# Patient Record
Sex: Female | Born: 1935
Health system: Southern US, Community
[De-identification: ages and names within clinical notes are randomized; demographics above are authoritative.]

## PROBLEM LIST (undated history)

## (undated) DIAGNOSIS — I4892 Unspecified atrial flutter: Secondary | ICD-10-CM

## (undated) DIAGNOSIS — I4891 Unspecified atrial fibrillation: Secondary | ICD-10-CM

## (undated) DIAGNOSIS — J849 Interstitial pulmonary disease, unspecified: Secondary | ICD-10-CM

## (undated) HISTORY — DX: Unspecified atrial fibrillation: I48.91

## (undated) HISTORY — DX: Interstitial pulmonary disease, unspecified: J84.9

## (undated) HISTORY — DX: Unspecified atrial flutter: I48.92

---

## 2005-09-24 ENCOUNTER — Ambulatory Visit: Payer: Self-pay | Admitting: Cardiology

## 2005-10-12 ENCOUNTER — Ambulatory Visit: Payer: Self-pay | Admitting: Cardiology

## 2005-10-19 ENCOUNTER — Inpatient Hospital Stay (HOSPITAL_BASED_OUTPATIENT_CLINIC_OR_DEPARTMENT_OTHER): Admission: RE | Admit: 2005-10-19 | Discharge: 2005-10-19 | Payer: Self-pay | Admitting: Cardiology

## 2005-10-19 ENCOUNTER — Ambulatory Visit: Payer: Self-pay | Admitting: Cardiovascular Disease

## 2005-11-10 ENCOUNTER — Ambulatory Visit: Payer: Self-pay | Admitting: Cardiology

## 2008-12-29 ENCOUNTER — Ambulatory Visit: Payer: Self-pay | Admitting: Cardiology

## 2008-12-31 ENCOUNTER — Inpatient Hospital Stay (HOSPITAL_COMMUNITY): Admission: AD | Admit: 2008-12-31 | Discharge: 2009-01-02 | Payer: Self-pay | Admitting: Cardiology

## 2008-12-31 ENCOUNTER — Ambulatory Visit: Payer: Self-pay | Admitting: Internal Medicine

## 2009-01-01 ENCOUNTER — Encounter: Payer: Self-pay | Admitting: Internal Medicine

## 2009-01-02 ENCOUNTER — Encounter: Payer: Self-pay | Admitting: Cardiology

## 2009-01-24 ENCOUNTER — Ambulatory Visit: Payer: Self-pay | Admitting: Cardiology

## 2009-02-04 ENCOUNTER — Ambulatory Visit: Payer: Self-pay | Admitting: Cardiology

## 2009-02-04 ENCOUNTER — Telehealth: Payer: Self-pay | Admitting: Cardiology

## 2009-02-04 ENCOUNTER — Encounter: Payer: Self-pay | Admitting: Cardiology

## 2009-02-07 ENCOUNTER — Ambulatory Visit: Payer: Self-pay | Admitting: Cardiology

## 2009-02-07 ENCOUNTER — Encounter: Payer: Self-pay | Admitting: Physician Assistant

## 2009-02-09 ENCOUNTER — Encounter: Payer: Self-pay | Admitting: Cardiology

## 2009-02-10 ENCOUNTER — Ambulatory Visit: Payer: Self-pay | Admitting: Cardiology

## 2009-02-11 ENCOUNTER — Encounter: Payer: Self-pay | Admitting: Cardiology

## 2009-02-12 ENCOUNTER — Encounter: Payer: Self-pay | Admitting: Cardiology

## 2009-02-14 ENCOUNTER — Encounter: Payer: Self-pay | Admitting: Cardiology

## 2009-02-15 ENCOUNTER — Encounter: Payer: Self-pay | Admitting: Cardiology

## 2009-02-25 ENCOUNTER — Encounter: Payer: Self-pay | Admitting: Cardiology

## 2009-02-27 ENCOUNTER — Encounter: Payer: Self-pay | Admitting: Cardiology

## 2009-03-12 ENCOUNTER — Ambulatory Visit: Payer: Self-pay | Admitting: Cardiology

## 2009-05-31 DIAGNOSIS — I4892 Unspecified atrial flutter: Secondary | ICD-10-CM

## 2009-05-31 DIAGNOSIS — I4891 Unspecified atrial fibrillation: Secondary | ICD-10-CM | POA: Insufficient documentation

## 2009-07-14 ENCOUNTER — Encounter: Payer: Self-pay | Admitting: Cardiology

## 2009-07-17 ENCOUNTER — Encounter: Payer: Self-pay | Admitting: Cardiology

## 2009-07-17 ENCOUNTER — Ambulatory Visit: Payer: Self-pay | Admitting: Cardiology

## 2009-07-17 DIAGNOSIS — G4700 Insomnia, unspecified: Secondary | ICD-10-CM | POA: Insufficient documentation

## 2009-07-17 DIAGNOSIS — F411 Generalized anxiety disorder: Secondary | ICD-10-CM | POA: Insufficient documentation

## 2009-07-17 DIAGNOSIS — I10 Essential (primary) hypertension: Secondary | ICD-10-CM | POA: Insufficient documentation

## 2009-07-17 DIAGNOSIS — J8410 Pulmonary fibrosis, unspecified: Secondary | ICD-10-CM | POA: Insufficient documentation

## 2009-07-19 ENCOUNTER — Encounter: Payer: Self-pay | Admitting: Cardiology

## 2009-07-23 ENCOUNTER — Encounter (INDEPENDENT_AMBULATORY_CARE_PROVIDER_SITE_OTHER): Payer: Self-pay | Admitting: *Deleted

## 2009-08-10 ENCOUNTER — Encounter: Payer: Self-pay | Admitting: Cardiology

## 2010-01-21 ENCOUNTER — Ambulatory Visit: Payer: Self-pay | Admitting: Cardiology

## 2010-01-21 DIAGNOSIS — R42 Dizziness and giddiness: Secondary | ICD-10-CM

## 2010-01-24 ENCOUNTER — Encounter: Payer: Self-pay | Admitting: Cardiology

## 2010-04-25 ENCOUNTER — Encounter: Payer: Self-pay | Admitting: Cardiology

## 2010-05-22 ENCOUNTER — Encounter: Payer: Self-pay | Admitting: Cardiology

## 2010-06-23 ENCOUNTER — Encounter: Payer: Self-pay | Admitting: Cardiology

## 2010-08-22 ENCOUNTER — Ambulatory Visit: Payer: Self-pay | Admitting: Cardiology

## 2010-08-22 DIAGNOSIS — R0602 Shortness of breath: Secondary | ICD-10-CM

## 2010-09-05 ENCOUNTER — Encounter: Payer: Self-pay | Admitting: Cardiology

## 2010-09-19 ENCOUNTER — Encounter: Payer: Self-pay | Admitting: Cardiology

## 2010-09-30 NOTE — Assessment & Plan Note (Signed)
Summary: 6 MO FU PER MAY REMINDER-SRS   Visit Type:  Follow-up Primary Provider:  Sherryll Burger   History of Present Illness: the patient is a 77 year old female with a history of paroxysmal atrial fibrillation maintaining normal sinus rhythm on amiodarone therapy. The patient also has a history of mild interstitial lung disease with a moderately decreased DLCO. Pulmonary function studies however less than a year ago showed stable DLCO. The patient also is a prior history of atrial flutter with radiofrequency catheter ablation. She had a normal heart catheterization with normal LV function. The patient continues to decline Coumadin.  She reports no recurrent palpitations short of breath orthopnea PND. The patient may complaints orthostatic dizziness. She states her to go into evening when she gets up she becomes dizzy for a few minutes and then slowly resolves. She also took her blood pressure readings and they have been within normal limits. The patient is on vasodilator therapy that could explain her orthostasis. She is currently not wearing compression stockings.  Current Medications (verified): 1)  Hydrochlorothiazide 12.5 Mg Caps (Hydrochlorothiazide) .... Take 1 Tablet By Mouth Once A Day 2)  Aspirin 325 Mg Tabs (Aspirin) .... Take 1 Tablet By Mouth Once A Day 3)  Omeprazole 20 Mg Cpdr (Omeprazole) .... Take 2 Tablet By Mouth Once A Day 4)  Diltiazem Hcl Cr 120 Mg Xr12h-Cap (Diltiazem Hcl) .... Take 1 Tablet By Mouth Once A Day 5)  Amiodarone Hcl 200 Mg Tabs (Amiodarone Hcl) .... Take 1 Tablet By Mouth Once A Day 6)  Alprazolam 0.25 Mg Tabs (Alprazolam) .... Take 1/2 Tablet By Mouth Once A Day 7)  Caltrate 600+d 600-400 Mg-Unit Tabs (Calcium Carbonate-Vitamin D) .... Take 1 Tablet By Mouth Two Times A Day 8)  Trazodone Hcl 50 Mg Tabs (Trazodone Hcl) .... Take 1/2 Tablet At Bedtime For Sleep, May Increase To One Tablet As Needed 9)  Lisinopril 10 Mg Tabs (Lisinopril) .... Take 1 Tablet By Mouth Once  A Day  Allergies (verified): No Known Drug Allergies  Comments:  Nurse/Medical Assistant: The patient's medications and allergies were reviewed with the patient and were updated in the Medication and Allergy Lists. List reviewed.  Past History:  Past Medical History: Last updated: 07/17/2009 ATRIAL FLUTTER (ICD-427.32) ATRIAL FIBRILLATION (ICD-427.31) Interstitial lung disease 1. Status post atrial flutter with 1:1 conduction status post     radiocatheter frequency ablation. 2. Atypical flutter and atrial fibrillation induced EP study with     recurrence requiring amiodarone therapy. 3. Currently normal sinus rhythm. 4. Normal catheterization several years ago.  Normal LV function. 5. The patient declining Coumadin therapy presently. 6. Interstitial lung disease with a TLC of 65%.  Family History: Reviewed history and no changes required. Noncontributory  Social History: Reviewed history from 05/31/2009 and no changes required. Retired  Married  Tobacco Use - No.   Review of Systems  The patient denies fatigue, malaise, fever, weight gain/loss, vision loss, decreased hearing, hoarseness, chest pain, palpitations, shortness of breath, prolonged cough, wheezing, sleep apnea, coughing up blood, abdominal pain, blood in stool, nausea, vomiting, diarrhea, heartburn, incontinence, blood in urine, muscle weakness, joint pain, leg swelling, rash, skin lesions, headache, fainting, dizziness, depression, anxiety, enlarged lymph nodes, easy bruising or bleeding, and environmental allergies.    Vital Signs:  Patient profile:   77 year old female Height:      64 inches Weight:      167 pounds Pulse rate:   67 / minute BP sitting:   154 / 86  (  left arm) Cuff size:   regular  Vitals Entered By: Carlye Grippe (Jan 21, 2010 10:16 AM)  Physical Exam  Additional Exam:  General: Well-developed, well-nourished in no distress head: Normocephalic and atraumatic eyes PERRLA/EOMI  intact, conjunctiva and lids normal nose: No deformity or lesions mouth normal dentition, normal posterior pharynx neck: Supple, no JVD.  No masses, thyromegaly or abnormal cervical nodes lungs: Normal breath sounds bilaterally without wheezing.  Normal percussion heart: regular rate and rhythm with normal S1 and S2, no S3 or S4.  PMI is normal.  No pathological murmurs abdomen: Normal bowel sounds, abdomen is soft and nontender without masses, organomegaly or hernias noted.  No hepatosplenomegaly musculoskeletal: Back normal, normal gait muscle strength and tone normal pulsus: Pulse is normal in all 4 extremities Extremities: No peripheral pitting edema neurologic: Alert and oriented x 3 skin: Intact without lesions or rashes cervical nodes: No significant adenopathy psychologic: Normal affect    EKG  Procedure date:  01/21/2010  Findings:      normal sinus rhythm. Nonspecific ST-T wave changes. Heart rate 64 beats per minute  Impression & Recommendations:  Problem # 1:  ATRIAL FLUTTER (ICD-427.32) no recurrence status post ablation Her updated medication list for this problem includes:    Aspirin 325 Mg Tabs (Aspirin) .Marland Kitchen... Take 1 tablet by mouth once a day    Amiodarone Hcl 200 Mg Tabs (Amiodarone hcl) .Marland Kitchen... Take 1/2 tablet by mouth once a day  Problem # 2:  ATRIAL FIBRILLATION (ICD-427.31) patient remaining in normal sinus rhythm on amiodarone therapy. However given her mild degree of interstitial lung disease and long-term therapy we will decrease her amiodarone therapy 200 mg a day. Her updated medication list for this problem includes:    Aspirin 325 Mg Tabs (Aspirin) .Marland Kitchen... Take 1 tablet by mouth once a day    Amiodarone Hcl 200 Mg Tabs (Amiodarone hcl) .Marland Kitchen... Take 1/2 tablet by mouth once a day  Orders: EKG w/ Interpretation (93000) T-Hepatic Function (44034-74259) T-TSH (56387-56433)  Problem # 3:  ORTHOSTATIC DIZZINESS (ICD-780.4) likely secondary to patient's  advanced age and multiple vasodilator drugs. Decrease her lisinopril to 5 mg p.o. q. daily and recommended compression stockings. I would not further decrease her blood pressure medications as the patient does have hypertension.  Problem # 4:  COUMADIN THERAPY (ICD-V58.61) patient continues to decline Coumadin therapy.  Patient Instructions: 1)  Decrease Amiodarone to 100mg  by mouth once daily. This is 1/2 of your 200 mg tablet. 2)  Decrease Lisinopril to 5mg  by mouth once daily. This is 1/2 of your 10mg  tablet. 3)  Your physician recommends that you go to the [facility] for lab work. Do not eat or drink after midnight.  4)  Wear compression stockings. 5)  Your physician wants you to follow-up in: 6 months. You will receive a reminder letter in the mail one-two months in advance. If you don't receive a letter, please call our office to schedule the follow-up appointment.

## 2010-09-30 NOTE — Medication Information (Signed)
Summary: Compression Stockings RX  Compression Stockings [facility]   Imported By: Cyril Loosen, RN, BSN 01/21/2010 14:22:17  _____________________________________________________________________  External Attachment:    Type:   Image     Comment:   External Document

## 2010-10-02 NOTE — Assessment & Plan Note (Signed)
Summary: 6 MO FU PER NOV REMINDER-SRS   Visit Type:  Follow-up Primary Provider:  Sherryll Burger   History of Present Illness: patient presents for scheduled 6 month followup.  She does not present with signs or symptoms suggestive of any significant change from her baseline level of exercise tolerance. She continues to report DOE, but no orthopnea, PND, or significant LE edema. She denies exertional chest pain. She continues to have some positional dizziness, but no frank syncope, falls, or gait instability.  The issue of Coumadin anticoagulation had been broached in the past. she continues to decline Coumadin, and is on full dose aspirin.  She also spoke about recommended cholecystectomy, which she is postponing until after the holidays. She also states that she needs a tooth pulled. She complains of intermittent back pain, when washing dishes.  She has intermittent palpitations, but with no significant change from baseline.  Preventive Screening-Counseling & Management  Alcohol-Tobacco     Smoking Status: never  Current Medications (verified): 1)  Hydrochlorothiazide 12.5 Mg Caps (Hydrochlorothiazide) .... Take 1 Tablet By Mouth Once A Day 2)  Aspirin 325 Mg Tabs (Aspirin) .... Take 1 Tablet By Mouth Once A Day 3)  Omeprazole 20 Mg Cpdr (Omeprazole) .... Take 2 Tablet By Mouth Once A Day 4)  Diltiazem Hcl Cr 120 Mg Xr12h-Cap (Diltiazem Hcl) .... Take 1 Tablet By Mouth Once A Day 5)  Amiodarone Hcl 200 Mg Tabs (Amiodarone Hcl) .... Take 1 Tablet By Mouth Once A Day 6)  Alprazolam 0.25 Mg Tabs (Alprazolam) .... Take 1/2 Tablet By Mouth Once A Day 7)  Caltrate 600+d 600-400 Mg-Unit Tabs (Calcium Carbonate-Vitamin D) .... Take 1 Tablet By Mouth Two Times A Day 8)  Trazodone Hcl 50 Mg Tabs (Trazodone Hcl) .... Take 1/2 Tablet At Bedtime For Sleep, May Increase To One Tablet As Needed 9)  Lisinopril 10 Mg Tabs (Lisinopril) .... Take 1/2 Tablet By Mouth Once A Day (5mg )  Allergies: No Known Drug  Allergies  Comments:  Nurse/Medical Assistant: The patient's medications and allergies were reviewed with the patient and were updated in the Medication and Allergy Lists. Pt brought a list of medications to office visit.  Cyril Loosen, RN, BSN (August 22, 2010 2:04 PM)  Past History:  Past Medical History: Last updated: 07/17/2009 ATRIAL FLUTTER (ICD-427.32) ATRIAL FIBRILLATION (ICD-427.31) Interstitial lung disease 1. Status post atrial flutter with 1:1 conduction status post     radiocatheter frequency ablation. 2. Atypical flutter and atrial fibrillation induced EP study with     recurrence requiring amiodarone therapy. 3. Currently normal sinus rhythm. 4. Normal catheterization several years ago.  Normal LV function. 5. The patient declining Coumadin therapy presently. 6. Interstitial lung disease with a TLC of 65%.  Review of Systems       No fevers, chills, hemoptysis, dysphagia, melena, hematocheezia, hematuria, rash, claudication, orthopnea, pnd, pedal edema. All other systems negative.   Vital Signs:  Patient profile:   77 year old female Height:      64 inches Weight:      149.25 pounds BMI:     25.71 Pulse rate:   69 / minute BP sitting:   142 / 79  (left arm) Cuff size:   regular  Vitals Entered By: Cyril Loosen, RN, BSN (August 22, 2010 2:01 PM)  Nutrition Counseling: Patient's BMI is greater than 25 and therefore counseled on weight management options. Comments Follow up office visit   Physical Exam  Additional Exam:  GEN: 77 year old female, in no distress  HEENT: NCAT,PERRLA,EOMI NECK: palpable pulses, no bruits; no JVD; no TM LUNGS: CTA bilaterally HEART: RRR (S1S2); no significant murmurs; no rubs; no gallops ABD: soft, NT; intact BS EXT: intact distal pulses; trace peripheral edema SKIN: warm, dry MUSC: no obvious deformity NEURO: A/O (x3)     Impression & Recommendations:  Problem # 1:  ATRIAL FLUTTER  (ICD-427.32)  maintaining NSR, by clinical presentation and physical examination. Status post RF ablation.  Problem # 2:  ATRIAL FIBRILLATION (ICD-427.31)  maintaining NSR on amiodarone. Patient continues to decline Coumadin (CHADS: 2, HTN, age), and is on full dose aspirin.  Problem # 3:  INTERSTITIAL LUNG DISEASE (ICD-515)  we'll order surveillance PFTs with DLCO and CXR, in context of amiodarone therapy. patient had normal TSH and liver function tests, earlier this year.  Problem # 4:  ORTHOSTATIC DIZZINESS (ICD-780.4)  as had been previously recommended, will decrease ACE inhibitor to 5 mg daily.  Other Orders: EKG w/ Interpretation (93000) T-Chest x-ray, 2 views (47829) Pulmonary Function Test (PFT)  Patient Instructions: 1)  Decrease Lisinopril to 5mg  daily 2)  PFT 3)  chest x-ray 4)  Follow up in  6 months

## 2010-11-12 DIAGNOSIS — I4891 Unspecified atrial fibrillation: Secondary | ICD-10-CM

## 2010-11-12 DIAGNOSIS — Z01810 Encounter for preprocedural cardiovascular examination: Secondary | ICD-10-CM

## 2010-11-13 DIAGNOSIS — R072 Precordial pain: Secondary | ICD-10-CM

## 2010-12-09 LAB — CBC
MCHC: 34 g/dL (ref 30.0–36.0)
MCV: 91.4 fL (ref 78.0–100.0)
Platelets: 212 10*3/uL (ref 150–400)
RBC: 4.34 MIL/uL (ref 3.87–5.11)
RDW: 14.3 % (ref 11.5–15.5)

## 2010-12-09 LAB — BASIC METABOLIC PANEL
BUN: 9 mg/dL (ref 6–23)
CO2: 28 mEq/L (ref 19–32)
Calcium: 8.6 mg/dL (ref 8.4–10.5)
Chloride: 104 mEq/L (ref 96–112)
Creatinine, Ser: 0.87 mg/dL (ref 0.4–1.2)
GFR calc Af Amer: >60 mL/min (ref >60)

## 2010-12-09 LAB — MAGNESIUM: Magnesium: 2.2 mg/dL (ref 1.5–2.5)

## 2011-01-13 NOTE — Op Note (Signed)
NAMECASSADI, PURDIE             ACCOUNT NO.:  [id number]   MEDICAL RECORD NO.:  [id number]          PATIENT TYPE:  INP   LOCATION:  2504                         FACILITY:  [facility]   PHYSICIAN:  Hillis Range, MD       DATE OF BIRTH:  [birth year]   DATE OF PROCEDURE:  DATE OF DISCHARGE:                               OPERATIVE REPORT   PREPROCEDURE DIAGNOSIS:  Atrial flutter.   POSTPROCEDURE DIAGNOSES:  1. Typical-appearing isthmus from right atrial flutter.  2. Atypical atrial flutter.  3. Atrial fibrillation.   PROCEDURES:  1. Comprehensive EP study.  2. Coronary sinus pacing and recording.  3. Mapping of SVT.  4. Ablation of SVT.  5. Ibutilide infusion/chemical cardioversion.   INTRODUCTION:  Ms. Erica [name] is a pleasant 77 year old female with a  history of palpitations.  She recently presented to [facility]  with atrial flutter with 1:1 AV conduction.  EKG was suggestive of  typical-appearing atrial flutter.  She received intravenous diltiazem  and converted to sinus rhythm.  She now presents for EP study and  radiofrequency ablation.   DESCRIPTION OF THE PROCEDURE:  Informed written consent was obtained and  the patient was brought to the Electrophysiology Lab in the fasting  state.  She was adequately sedated with intravenous Versed and fentanyl  as outlined in the nursing report.  The patient's right groin was  prepped and draped in the usual sterile fashion by the EP lab staff.  Using a percutaneous Seldinger technique, two 6-French and one 8-French  hemostasis sheaths were placed in the right common femoral vein.  A 6-  French decapolar Polaris X catheter was introduced through the right  common femoral vein and advanced into the coronary sinus for recording  and pacing from this location.  A 6-French quadripolar Josephson  catheter was introduced through the right common femoral vein and  advanced into the right ventricle for recording and pacing.  This  catheter  was then pulled back to the His bundle location.  The patient  presented to the Electrophysiology Lab in normal sinus rhythm.  Her RR  interval measured 1026 milliseconds with a PR interval of 167  milliseconds, QT interval of 445 milliseconds, and QRS duration of 106  milliseconds.  The AH interval measured 108 milliseconds with an HV  interval of 41 milliseconds.  Ventricular pacing was performed which  revealed midline VA conduction with VA Wenckebach at 700 milliseconds.  Atrial pacing was performed, which revealed no evidence of PR greater  than RR.  The AV Wenckebach cycle length measured 410 milliseconds with  no tachycardias induced.  Atrial extrastimulus testing was performed,  which revealed decremental AV conduction with an atrial ERP of 500/250  milliseconds with no tachycardias induced.  There was no AH jump or echo  beats noted.  Rapid atrial pacing was again performed and the patient  was found to have inducible atrial tachycardia when pacing and at a  cycle length of 250 milliseconds.  The tachycardia cycle length measured  232 milliseconds with a right atrial activation preceding the left  atrial activation.  A surface electrogram  was not consistent with  typical-appearing atrial flutter, however.  Before the tachycardia could  be anatomically mapped or entrained, the tachycardia cycle length  shifted as well as the atrial activation sequence.  The patient was then  noted to have multiple activation sequences of the atria as well as  variable cycle lengths.  The patient was noted to have frequent earliest  activation from the left atrium.  The patient then degenerated into  atrial fibrillation and remained in atrial fibrillation thereafter.  Given the patient's prior EKGs suggestive of typical atrial flutter, I  elected to ablate along the usual cavotricuspid isthmus.  A series of 16  radiofrequency applications were delivered between the tricuspid valve  annulus and the  inferior vena cava along the usual cavotricuspid isthmus  with a duration of 120 seconds each.  The isthmus was noted to be very  short.  The patient remained in atrial fibrillation during the  procedure.  After extensive cavotricuspid ablation, ibutilide was  infused at 1 mg over 10 minutes.  The patient was noted to occasionally  convert to sinus rhythm but immediately returned to atypical atrial  flutter and atrial fibrillation.  There was no further evidence of right  atrial flutter by coronary sinus activation sequence.  Again, the atrial  cycle length was noted to be variable with variable activation along the  coronary sinus.  An additional 0.5 mg of ibutilide was infused, and the  patient's QT interval was followed closely.  The patient subsequently  converted to sinus rhythm.  Once the patient had converted to sinus  rhythm, the ablation catheter was positioned in the low lateral right  atrium.  Differential atrial pacing was performed, which revealed a  stimulus to earliest atrial activation across both sides of the isthmus  of 150 milliseconds.  Differential atrial pacing from low lateral right  atrium was suggestive of isthmus block.  The procedure was therefore  considered completed.  All catheters were removed and sheaths were  aspirated and flushed.  The sheaths were removed and hemostasis was  assured.  There were no early apparent complications.   CONCLUSIONS:  1. Sinus rhythm upon presentation.  2. Multiple atypical atrial flutter circuits and atrial fibrillation      induced with rapid atrial pacing, which was sustained and required      ibutilide infusion for termination of the arrhythmia.  3. Cavotricuspid isthmus ablation was performed with isthmus block      achieved.  4. No early apparent complications.      Hillis Range, MD  Electronically Signed     JA/MEDQ  D:  01/01/2009  T:  01/02/2009  Job:  [id number]   cc:   Learta Codding, MD,FACC  D[facility] Salvia,  MD, [facility]

## 2011-01-13 NOTE — Assessment & Plan Note (Signed)
[facility]                          [name]   KAYRON, KALMAR                    MRN:          [id number]  DATE:03/12/2009                            DOB:          05-Oct-1927    REFERRING PHYSICIAN:  Kirstie Peri, MD   HISTORY OF PRESENT ILLNESS:  The patient is a 77 year old female with a  history of atrial flutter with 1:1 conduction status post radiocatheter  frequency ablation.  Unfortunately, the patient however has also  atypical flutter and recurrent atrial fibrillation requiring initiation  of amiodarone therapy.  PFTs were obtained initially because the patient  had some evidence of abnormalities on chest x-ray consistent with  emphysema and interstitial lung disease.  Her DLCO, however, was 65 and  her FEV-1 was 91% of predicted.  She was started on amiodarone and has  not experienced any worsening dyspnea.  She has now remained in normal  sinus rhythm after several hospitalizations.  She denies any chest pain,  orthopnea, PND, palpitations, or syncope.  Unfortunately, she is  suffering from the shingles currently and has taken acyclovir.   MEDICATIONS:  1. Hydrochlorothiazide 12.5 mg p.o. daily.  2. Captopril 100 mg p.o. daily.  3. Aspirin 325 mg daily p.o. daily.  4. Omeprazole 20 mg p.o. daily.  5. Diltiazem CD 120 mg p.o. daily.  6. Amiodarone 200 mg p.o. b.i.d.  7. Acyclovir 400 mg 2 tablets 5 times for 5 days.   PHYSICAL EXAMINATION:  VITAL SIGNS:  Blood pressure 142/84, heart rate  80, weight 160 pounds.  NECK:  Normal carotid upstroke.  No carotid bruits.  LUNGS:  Clear breath sounds bilaterally.  HEART:  Regular rate and rhythm.  Normal S1 and S2.  No murmur, rubs, or  gallops.  ABDOMEN:  Soft.  EXTREMITIES:  No cyanosis, clubbing, or edema.  NEURO:  The patient is alert and oriented.  Grossly nonfocal.   PROBLEMS:  1. Status post atrial flutter with 1:1 conduction status post  radiocatheter frequency ablation.  2. Atypical flutter and atrial fibrillation induced EP study with      recurrence requiring amiodarone therapy.  3. Currently normal sinus rhythm.  4. Normal catheterization several years ago.  Normal LV function.  5. The patient declining Coumadin therapy presently.  6. Interstitial lung disease with a TLC of 65%.   PLAN:  1. We will have to be very careful with amiodarone and the patient      should be monitored in 6-8 weeks with repeat pulmonary function      tests and DLCO to make sure that she has no adverse effects from      the drug.  As she does not report any increased shortness of      breath, as a matter fact she feels much better and normal sinus      rhythm currently  2. The patient will follow up with [place] in 6-8 weeks at which time we      will also will do TSH and liver function test.     Learta Codding, MD,FACC  Electronically Signed  GED/MedQ  DD: 03/12/2009  DT: 03/13/2009  Job #: [id number]   cc:   Kirstie Peri, MD

## 2011-01-13 NOTE — Consult Note (Signed)
Erica [name], SAUERWEIN NO.:  [id number]   MEDICAL RECORD NO.:  [id number]          PATIENT TYPE:  INP   LOCATION:  4714                         FACILITY:  [facility]   PHYSICIAN:  Duke Salvia, MD, FACCDATE OF BIRTH:  1928/08/09   DATE OF CONSULTATION:  12/31/2008  DATE OF DISCHARGE:                                 CONSULTATION   Thank you very much for asking [place] to see [name] in consultation  because of atrial flutter with 1:1 conduction.   Erica [name] is a 77 year old woman with thromboembolic risk factors  notable for age, gender, and hypertension who presented to the hospital  48 hours ago with tachy palpitations, chest discomfort described as  tightness, and fullness without shortness of breath and accompanied by  some nausea.  She was found to be in atrial flutter at 1:1 conduction  after adenosine demonstrated intermittent heart block and underlying  flutter waves.  She reverted to sinus rhythm subsequently.  The chart  describes some episodes of nonsustained atrial fibrillation according to  Dr. Margarita Mail notes.  There are no strips to confirm that.   The patient has had a history of intermittent palpitations going back a  number of years and she thinks that maybe the symptoms were like this  but she is not sure.   She also carries a history of low atrial tachycardia, that apparently  resolved with beta-blocker therapy.  This was identified by Dr. Andee Lineman  in 2007.   Her cardiac history is also notable for normal left ventricular  function, a false positive Myoview prompted a catheterization in  February 2007 that demonstrated no obstructive coronary disease.   PAST MEDICAL HISTORY:  In addition to above, is notable for:  1. Allergic rhinitis.  2. GE reflux disease.  3. History of pneumonia.   MEDICATIONS ALLERGIES:  Listed variably and she denies all of them,  specifically they include LISINOPRIL, BIAXIN, and LEVAQUIN.   FAMILY HISTORY:   Noncontributory.   REVIEW OF SYSTEMS:  Broadly reviewed on admission to hospital and was  negative.   CURRENTLY MEDICATIONS:  1. Lovenox 1 mg/kg subcu.  2. Toprol 25.  3. Protonix 40.  4. Potassium.  5. Xanax 0.125 daily.  6. Aspirin.   PHYSICAL EXAMINATION:  GENERAL:  She is an elderly Caucasian female  appearing her stated age of 7.  She was in no acute distress.  VITAL SIGNS:  Her blood pressure is 116/65, her pulse was 65 and  regular, and her respirations were 16 unlabored.  HEENT:  Demonstrated no icterus or xanthoma.  NECK:  Veins were flat.  Carotids brisk and full bilaterally without  bruits.  There was no lymphadenopathy.  BACK:  Without kyphosis or scoliosis.  LUNGS:  Clear.  HEART:  Sounds were regular with an S4.  ABDOMEN:  Soft with active bowel sounds without midline pulsation or  hepatomegaly.  EXTREMITIES:  Femoral pulses were 2+.  Distal pulses were intact.  There  is no clubbing, cyanosis, or edema.  NEUROLOGICAL:  Grossly normal.  Affect was also normal  SKIN:  Warm and dry.  Electrocardiogram on admission to the hospital demonstrated a narrow QRS  tachycardia with cycle length of 280 milliseconds.  The axis was normal.  There was ST-segment depression rather diffusely.   Adenosine strips demonstrated high-grade conduction block.  Subsequent  electrocardiogram a number of hours later demonstrates sinus rhythm at  63 with intervals of 0.16/0.09/0.44.   Laboratories were notable for a normal hemogram, potassium was 3.2 on  admission with normal creatinine.  Cardiac enzymes were negative.   IMPRESSION:  1. Atrial flutter - typical with a relatively slow atrial rate      resulting in 1:1 antegrade conduction.  2. Thromboembolic risk factors notable for age, hypertension, and      gender.  3. Remote history of low atrial tachycardia.  4. Normal left ventricular function with a false positive Myoview and      negative catheterization.   DISCUSSION:   Erica [name] has atrial flutter with a history of atrial  tachycardia, but no other atrial arrhythmias which we are aware.  What  is described as atrial fibrillation in notes I think is probably 2:1  flutter.  We have discussed treatment options including catheter  ablation versus medical therapy.  We discussed potential benefits as  well as potential risks including but not limited to death, perforation,  infection, and heart block requiring pacemaker implantation, also  discussed potential risks associated with Coumadin therapy.  She would  like to proceed with catheter ablation.  She will also undergo mapping  for atrial tachycardia.   We will plan to discontinue her Cardizem and undertake the procedure in  the morning.      Duke Salvia, MD, [facility]  Electronically Signed     SCK/MEDQ  D:  12/31/2008  T:  01/01/2009  Job:  [id number]   cc:   Kirstie Peri, MD  [facility]

## 2011-01-13 NOTE — Discharge Summary (Signed)
NAMEBRIUNNA, [name]             ACCOUNT NO.:  [id number]   MEDICAL RECORD NO.:  [id number]          PATIENT TYPE:  INP   LOCATION:  2504                         FACILITY:  [facility]   PHYSICIAN:  Hillis Range, MD       DATE OF BIRTH:  February 15, 1928   DATE OF ADMISSION:  12/31/2008  DATE OF DISCHARGE:  01/02/2009                               DISCHARGE SUMMARY   DISCHARGING DIAGNOSIS:  Atrial flutter status post cavotricuspid isthmus  ablation by Dr. Hillis Range this admission.  The patient is being  discharged home in stable condition.  Anticoagulation therapy with  Coumadin has been discussed with the patient by Dr. Johney Frame for stroke  prevention.  The patient would like to contemplate Coumadin and  discussing with Dr. Andee Lineman in followup before initiation.   PAST MEDICAL HISTORY:  1. Atrial fibrillation/atrial flutter.  2. Allergic rhinitis.  3. GERD.  4. False positive Myoview study prompting a catheterization in      February 2007 that demonstrated no coronary artery disease.  5. Multiple allergies.  Listed include LISINOPRIL, BIAXIN, and      LEVAQUIN.   HOSPITAL COURSE:  Erica [name] presented on day of admission for EP study.  She initially presented to [facility] for dizziness and  was found to be in narrow QRS tachycardia, felt to be atrial flutter and  was treated with Cardizem.  The patient with a [place] score of at least 2.  Started on aspirin 325 mg, continued on beta-blocker therapy, Lovenox  and transferred to [facility].  The patient underwent the above  procedure on Jan 01, 2009, tolerated the procedure without complications,  has maintained sinus rhythm, and is being discharged home to follow up  with Dr. Andee Lineman on Jan 24, 2009.  Dr. Johney Frame has seen the patient on day  of discharge.  Per Dr. Jenel Lucks note, the patient with multiple atypical  flutters and atrial fib induced with EP study.  Suspicious that she will  eventually require antiarrhythmic  medication.  The patient was started  on Cardizem CD 120 mg p.o. in addition to beta-blocker therapy.  The  patient to follow up with Dr. Andee Lineman regarding Coumadin therapy, as she  is not inclined to start it at this time.  Prior to discharge, the  patient's magnesium 2.2, potassium 3.6, creatinine 0.8, and hematocrit  39.7.   DISCHARGE MEDICATIONS:  1. Cardizem CD 120 mg daily.  2. Captopril 50 mg b.i.d.  3. Atenolol 50 mg daily.  4. Xanax as previously prescribed.  5. Hydrochlorothiazide 12.5 daily.  6. Caltrate b.i.d.  7. Aspirin 325 mg daily.  8. Prilosec OTC.   FOLLOWUP:  The patient is scheduled to see Dr. Andee Lineman on Jan 24, 2009 at  2:30.   DURATION OF DISCHARGE ENCOUNTER:  Less than 30 minutes.      Dorian Pod, ACNP      Hillis Range, MD  Electronically Signed    MB/MEDQ  D:  01/02/2009  T:  01/02/2009  Job:  [id number]

## 2011-01-13 NOTE — Assessment & Plan Note (Signed)
[name]                          Erica [name]   Erica [name], Erica [name]                    MRN:          [id number]  DATE:01/24/2009                            DOB:          March 07, 1928    HISTORY OF PRESENT ILLNESS:  The patient is a 77 year old female with a  history of ectopic atrial tachycardia who presented with a narrow QRS  tachycardia, dizziness, and weakness to Erica [name] on Dec 29, 2008.  The patient appeared to be in atrial flutter with 1:1 conduction.  The patient was referred for radio-catheter frequency ablation.  She  underwent ablation of atrial flutter, but also was found to have  multiple atrial flutters and atrial fibrillation induced with EP study.  The patient was discharged on beta-blocker and Cardizem.  However, she  was not discharged on Coumadin as the patient was reluctant to take the  medication.  She now presents for followup.  The patient states that she  is short of breath on exertion.  She also feels dizzy at times.  There  is also a component of anxiety.  Chest x-ray also showed that she has  some element of emphysema with possibly interstitial lung disease.  An  EKG today in the office demonstrates normal sinus rhythm with occasional  PVCs.   MEDICATIONS:  1. Atenolol 50 mg p.o. daily.  2. Hydrochlorothiazide 12.5 mg p.o. daily.  3. Caltrate.  4. Prilosec.  5. Captopril 100 mg p.o. daily.  6. Aspirin 325 mg p.o. daily.  7. Omeprazole 20 mg p.o. daily.  8. Diltiazem CD 120 mg p.o. daily.   PHYSICAL EXAMINATION:  VITAL SIGNS:  Blood pressure 114/70, heart rate  69, weight 155.  NECK:  Normal carotid stroke.  No carotid bruits.  LUNGS:  Clear breath sounds bilaterally.  HEART:  Regular rate and rhythm.  Normal S1 and S2.  No murmur, rubs, or  gallops.  ABDOMEN:  Soft, nontender.  No rebound or guarding.  Good bowel sounds.  EXTREMITIES:  No cyanosis, clubbing, or edema.  NEUROLOGIC:  The  patient is alert and oriented and grossly nonfocal.   PROBLEM LIST:  1. Status post atrial flutter with 1:1 conduction status post radio-      catheter frequency ablation.  2. Atypical flutter and atrial fibrillation induced during      electrophysiologic study.  3. Normal sinus rhythm.  4. Normal catheterization several years ago with normal left      ventricular function.  5. Not on Coumadin anticoagulation.  Congestive heart failure,      hypertension, age, diabetes, prior stroke score is 2.   PLAN:  1. The patient does have significant shortness of breath.  It is not      entirely clear what the cause is, but it could be that she has      relatively slow heart rate and is on multiple negative      chronotropes.  Although she was started on diltiazem for atrial      fibrillation, the patient is in normal sinus rhythm and will stop  her diltiazem CD 120 mg.  2. The patient has a very abnormal chest x-ray, and I will send her      for pulmonary function tests.  3. We will have the patient come back in 1 month and readdress the      issue of Coumadin as well as determine what the predominant cause      of her dyspnea.  It could be multifactorial, both      emphysema/interstitial lung disease as well as a contributor from      the multiple drugs causing chronotropic insufficiency.     Learta Codding, MD,FACC  Electronically Signed    GED/MedQ  DD: 01/24/2009  DT: 01/25/2009  Job #: [id number]   cc:   Kirstie Peri, MD

## 2011-01-16 NOTE — Cardiovascular Report (Signed)
NAMEMARIACRISTINA, [name]             ACCOUNT NO.:  [id number]   MEDICAL RECORD NO.:  [id number]          PATIENT TYPE:  OIB   LOCATION:  1962                         FACILITY:  [facility]   PHYSICIAN:  Charlton Haws, M.D.     DATE OF BIRTH:  Apr 17, 1928   DATE OF PROCEDURE:  10/19/2005  DATE OF DISCHARGE:                              CARDIAC CATHETERIZATION   PROCEDURE:  Coronary arteriography.   INDICATIONS:  A 77 year old patient with recurrent chest pain and Myoview  suggesting inferior wall ischemia.   Cine catheterization was done from the right femoral artery with 4-French  catheters.   Left main coronary was normal.   Left anterior descending artery was normal in the proximal and distal  segment.  Midvessel had 20-30% tubular disease.   First and second diagonal branches were normal.   Circumflex coronary artery was co-dominant.  There were two large obtuse  marginal branches and a fairly significant AV groove branch.  They are all  normal.   The right coronary artery was co-dominant but somewhat small.  It was  normal.   RAO ventriculography:  RAO ventriculography was normal.  EF was 55%.  There  was no gradient across the aortic valve and no MR.  Aortic pressure was in  the 135/65 range.  LV pressure was in the 139/12 range.   IMPRESSION:  The patient would appear to have false positive Myoview.  Her  chest pain would appear to be noncardiac in etiology.  She tolerated the  procedure well.  She will follow with Dr. Eliberto Ivory in [place].           ______________________________  Charlton Haws, M.D.     PN/MEDQ  D:  10/19/2005  T:  10/19/2005  Job:  [id number]   cc:   Lewayne Bunting, M.D.  [facility]   Dr. Edilia Bo, [place]

## 2011-05-27 ENCOUNTER — Encounter: Payer: Self-pay | Admitting: Cardiology

## 2011-05-29 ENCOUNTER — Ambulatory Visit (INDEPENDENT_AMBULATORY_CARE_PROVIDER_SITE_OTHER): Payer: Medicare Other | Admitting: Cardiology

## 2011-05-29 ENCOUNTER — Encounter: Payer: Self-pay | Admitting: Cardiology

## 2011-05-29 VITALS — BP 123/74 | HR 67 | Ht 64.0 in | Wt 135.0 lb

## 2011-05-29 DIAGNOSIS — I10 Essential (primary) hypertension: Secondary | ICD-10-CM

## 2011-05-29 DIAGNOSIS — I4892 Unspecified atrial flutter: Secondary | ICD-10-CM

## 2011-05-29 DIAGNOSIS — I4891 Unspecified atrial fibrillation: Secondary | ICD-10-CM

## 2011-05-29 DIAGNOSIS — R0602 Shortness of breath: Secondary | ICD-10-CM

## 2011-05-29 NOTE — Assessment & Plan Note (Signed)
Resolved. Patient does have some interstitial lung disease as outlined above. DLCO 68%. Symptoms however stable. No indication for recurrent volar function tests as of present

## 2011-05-29 NOTE — Patient Instructions (Signed)
Your physician recommends that you go to the [facility] for lab work for CBC, CMET, & TSH. If the results of your test are normal or stable, you will receive a letter.  If they are abnormal, the nurse will contact you by phone. Your physician wants you to follow up in: 6 months.  You will receive a reminder letter in the mail one-two months in advance.  If you don't receive a letter, please call our office to schedule the follow up appointment

## 2011-05-29 NOTE — Assessment & Plan Note (Addendum)
No recurrent atrial fibrillation. The patient continues to decline Coumadin. In sinus rhythm on amiodarone therapy. We'll check TSH.

## 2011-05-29 NOTE — Progress Notes (Signed)
History of present illness: Patient is an 77 year old female with paroxysmal atrial fibrillation, in normal sinus rhythm on amiodarone therapy. She has a history of mild interstitial lung disease with moderately reduced DLCO. However her most recent DLCO is stable at 68%. Just prior history of atrial flutter status post radiofrequency catheter ablation. She has no coronary disease. She continues to decline Coumadin. The patient presents for followup. She is doing quite well. She is finally able to have the appropriate compression stockings and is able to wear them. She has no edema. She denies any chest pain. She has occasional palpitations which are very brief. She does report some chest wall tenderness when she gets upset. Otherwise he stable from a cardiovascular perspective.  Allergies-social history and family history: As reported previously  Medications: Listed in the chart  Past medical history: Listed in the chart  Review of systems: No recurrent edema. No fever chills. No melena or hematochezia. No dysuria or frequency. No falls. No presyncope or syncope  Physical examination: Vital signs listed below. General: White female in no distress HEENT: Pupils isocoric conjunctiva clear, oropharynx clear Neck: Normal carotid upstroke no carotid bruits, no thyromegaly nonnodular thyroid Lungs: Clear breath sounds bilaterally Heart: Regular rate and rhythm normal S1-S2 and no pathological murmurs Abdomen: Soft and nontender no rebound or guarding good bowel sounds Extremity exam: No cyanosis clubbing or edema Neuro: Patient alert and oriented and grossly nonfocal Psychiatric: Within normal limits  12-lead cardiogram: Normal sinus rhythm, PACs, significant baseline wander.

## 2011-11-25 ENCOUNTER — Ambulatory Visit (INDEPENDENT_AMBULATORY_CARE_PROVIDER_SITE_OTHER): Payer: Medicare Other | Admitting: Cardiology

## 2011-11-25 ENCOUNTER — Encounter: Payer: Self-pay | Admitting: Cardiology

## 2011-11-25 VITALS — BP 114/73 | HR 73 | Ht 66.0 in | Wt 132.8 lb

## 2011-11-25 DIAGNOSIS — R002 Palpitations: Secondary | ICD-10-CM

## 2011-11-25 DIAGNOSIS — I4891 Unspecified atrial fibrillation: Secondary | ICD-10-CM

## 2011-11-25 DIAGNOSIS — J849 Interstitial pulmonary disease, unspecified: Secondary | ICD-10-CM

## 2011-11-25 DIAGNOSIS — J8410 Pulmonary fibrosis, unspecified: Secondary | ICD-10-CM

## 2011-11-25 DIAGNOSIS — I10 Essential (primary) hypertension: Secondary | ICD-10-CM

## 2011-11-25 MED ORDER — ATENOLOL 25 MG PO TABS: 25.0000 mg | ORAL_TABLET | Freq: Every day | ORAL | Status: DC

## 2011-11-25 NOTE — Patient Instructions (Signed)
   Continue Amiodarone at 100mg  daily  Add Atenolol 25mg  daily Your physician wants you to follow up in: 6 months.  You will receive a reminder letter in the mail one-two months in advance.  If you don't receive a letter, please call our office to schedule the follow up appointment

## 2011-11-28 ENCOUNTER — Encounter: Payer: Self-pay | Admitting: Cardiology

## 2011-11-28 DIAGNOSIS — R002 Palpitations: Secondary | ICD-10-CM | POA: Insufficient documentation

## 2011-11-28 DIAGNOSIS — J849 Interstitial pulmonary disease, unspecified: Secondary | ICD-10-CM | POA: Insufficient documentation

## 2011-11-28 NOTE — Assessment & Plan Note (Signed)
Patient reports occasional palpitations but she is under a lot of stress because her husband has been diagnosed with a tumor in his spine.  It is not clear that she has recurrence of atrial fibrillation.  Her EKG today demonstrates normal sinus rhythm with incomplete left bundle branch block

## 2011-11-28 NOTE — Assessment & Plan Note (Signed)
Blood pressure well controlled

## 2011-11-28 NOTE — Progress Notes (Signed)
Erica Bottoms, MD, [facility] Board Certified in Adult Cardiovascular Medicine,Internal Medicine and Critical Care Medicine    CC: follow up patient with atrial arrhythmias  HPI:  The patient is a 78 year old female with a history of atrial arrhythmias including atrial flutter and atrial fibrillation.  She has been ablated for atrial flutter.  She is maintaining normal sinus rhythm for atrial fibrillation on amiodarone.  She continues to decline Coumadin for stroke prophylaxis.  We discussed this again today. From a cardiovascular standpoint is actually doing well.  She denies any chest pain shortness of breath orthopnea or PND.  She does report occasional palpitations which make her somewhat anxious.  She is going through a stressful period in her life with her husband who has a tumor versus his spine.she carries a diagnosis of mild interstitial lung disease but reports no functional limitations  PMH: reviewed and listed in Problem List in Electronic Records (and see below) Past Medical History  Diagnosis Date  . Atrial flutter     status post radiation catheter frequency ablation  . Atrial fibrillation     patient has declined Coumadin, normal sinus rhythm on amiodarone therapy  . Interstitial lung disease     mild interstitial lung disease with a DLCO of 68%   No past surgical history on file.  Allergies/SH/FHX : available in Electronic Records for review  No Known Allergies History   Social History  . Marital Status: Married    Spouse Name: N/A    Number of Children: N/A  . Years of Education: N/A   Occupational History  . Retired    Social History Main Topics  . Smoking status: Never Smoker   . Smokeless tobacco: Never Used  . Alcohol Use: No  . Drug Use: No  . Sexually Active: Not on file   Other Topics Concern  . Not on file   Social History Narrative  . No narrative on file   No family history on file.  Medications: Current Outpatient Prescriptions    Medication Sig Dispense Refill  . ALPRAZolam (XANAX) 0.25 MG tablet Take 0.125 mg by mouth 2 (two) times daily.       Marland Kitchen amiodarone (PACERONE) 200 MG tablet Take 100 mg by mouth daily.       Marland Kitchen aspirin 325 MG tablet Take 325 mg by mouth daily.        . Calcium Carbonate-Vitamin D (CALTRATE 600+D) 600-400 MG-UNIT per tablet Take 1 tablet by mouth 2 (two) times daily.       Marland Kitchen diltiazem (CARDIZEM CD) 120 MG 24 hr capsule Take 120 mg by mouth daily.        . hydrochlorothiazide (MICROZIDE) 12.5 MG capsule Take 12.5 mg by mouth daily.        Marland Kitchen lisinopril (PRINIVIL,ZESTRIL) 10 MG tablet Take 5 mg by mouth daily.        Marland Kitchen omeprazole (PRILOSEC) 20 MG capsule Take 40 mg by mouth daily.        . traZODone (DESYREL) 50 MG tablet Take 25 mg by mouth at bedtime as needed.       Marland Kitchen atenolol (TENORMIN) 25 MG tablet Take 1 tablet (25 mg total) by mouth daily.  30 tablet  6    ROS: No nausea or vomiting. No fever or chills.No melena or hematochezia.No bleeding.No claudication  Physical Exam: BP 114/73  Pulse 73  Ht 5\' 6"  (1.676 m)  Wt 132 lb 12 oz (60.215 kg)  BMI 21.43 kg/m2 General:well-nourished  white female in no distress Neck:normal carotid upstroke and no carotid bruits.no thyromegaly non-nodular thyroid Lungs:clear breath sounds bilaterally without wheezing Cardiac:regular rate and rhythm with normal S1-S2 no murmur rubs or gallops Vascular:no edema.  Normal distal pulses Skin:warm and dry Physcologic:normal affect  12lead AVW:[id number] sinus rhythm with incomplete left bundle branch block and prolonged QT likely secondary to amiodarone Limited bedside ECHO:N/A No images are attached to the encounter.   Assessment and Plan  ESSENTIAL HYPERTENSION, BENIGN Blood pressure well controlled.  Palpitations Patient reports occasional palpitations but she is under a lot of stress because her husband has been diagnosed with a tumor in his spine.  It is not clear that she has recurrence of atrial  fibrillation.  Her EKG today demonstrates normal sinus rhythm with incomplete left bundle branch block.  I recommended the addition of atenolol 25 mg by mouth daily  Atrial fibrillation Patient continues to decline Coumadin for atrial fibrillation.  She remains on amiodarone in normal sinus rhythm.Will check TSH during next clinic visit.  We will also check liver function tests.  Interstitial lung disease The patient does not report increased shortness of breath although it may not be unreasonable in a year to repeat pulmonary function test with DLCO with use of amiodarone.    Patient Active Problem List  Diagnoses  . ANXIETY STATE, UNSPECIFIED  . ESSENTIAL HYPERTENSION, BENIGN  . Atrial fibrillation  . ATRIAL FLUTTER  . INSOMNIA  . Shortness of breath  . Interstitial lung disease  . Palpitations

## 2011-11-28 NOTE — Assessment & Plan Note (Signed)
Patient continues to decline Coumadin for atrial fibrillation.  She remains on amiodarone in normal sinus rhythm.

## 2011-11-28 NOTE — Assessment & Plan Note (Signed)
The patient does not report increased shortness of breath although it may not be unreasonable in a year to repeat pulmonary function test with DLCO with use of amiodarone.

## 2012-07-04 ENCOUNTER — Encounter: Payer: Self-pay | Admitting: Cardiology

## 2012-07-04 ENCOUNTER — Other Ambulatory Visit: Payer: Self-pay | Admitting: Cardiology

## 2012-07-04 ENCOUNTER — Ambulatory Visit (INDEPENDENT_AMBULATORY_CARE_PROVIDER_SITE_OTHER): Payer: Medicare Other | Admitting: Physician Assistant

## 2012-07-04 VITALS — BP 131/82 | HR 67 | Ht 66.0 in | Wt 130.0 lb

## 2012-07-04 DIAGNOSIS — I10 Essential (primary) hypertension: Secondary | ICD-10-CM

## 2012-07-04 DIAGNOSIS — J849 Interstitial pulmonary disease, unspecified: Secondary | ICD-10-CM

## 2012-07-04 DIAGNOSIS — I4891 Unspecified atrial fibrillation: Secondary | ICD-10-CM

## 2012-07-04 DIAGNOSIS — Z79899 Other long term (current) drug therapy: Secondary | ICD-10-CM

## 2012-07-04 DIAGNOSIS — I4892 Unspecified atrial flutter: Secondary | ICD-10-CM

## 2012-07-04 DIAGNOSIS — Z5181 Encounter for therapeutic drug level monitoring: Secondary | ICD-10-CM

## 2012-07-04 DIAGNOSIS — J8410 Pulmonary fibrosis, unspecified: Secondary | ICD-10-CM

## 2012-07-04 LAB — PULMONARY FUNCTION TEST

## 2012-07-04 NOTE — Progress Notes (Signed)
Primary Cardiologist: Simona Huh, MD (new)  HPI: Patient presents for scheduled six-month followup.  Patient continues to do well since her last visit here in March, 2013, with Dr. Andee Lineman. She has occasional palpitations, but these are brief in duration and not associated with any lightheadedness, syncope, or falls.  She continues to remain quite independent; she lives alone, ambulates freely without use of cane/walker. We discussed the issue of Coumadin anticoagulation for stroke prophylaxis, and she continues to decline.  No Known Allergies  Current Outpatient Prescriptions  Medication Sig Dispense Refill  . ALPRAZolam (XANAX) 0.25 MG tablet Take 0.125 mg by mouth 2 (two) times daily.       Marland Kitchen amiodarone (PACERONE) 200 MG tablet Take 100 mg by mouth daily.       Marland Kitchen aspirin 325 MG tablet Take 325 mg by mouth daily.        . Calcium Carbonate-Vitamin D (CALTRATE 600+D) 600-400 MG-UNIT per tablet Take 1 tablet by mouth 2 (two) times daily.       Marland Kitchen diltiazem (CARDIZEM CD) 120 MG 24 hr capsule Take 120 mg by mouth daily.        . hydrochlorothiazide (MICROZIDE) 12.5 MG capsule Take 12.5 mg by mouth daily.        Marland Kitchen lisinopril (PRINIVIL,ZESTRIL) 10 MG tablet Take 5 mg by mouth daily.        Marland Kitchen omeprazole (PRILOSEC) 20 MG capsule Take 40 mg by mouth daily.        . promethazine (PHENERGAN) 25 MG tablet Take 25 mg by mouth every 6 (six) hours as needed.      . traZODone (DESYREL) 50 MG tablet Take 25 mg by mouth at bedtime as needed.         Past Medical History  Diagnosis Date  . Atrial flutter     Status post RFA  . Atrial fibrillation     Declines Coumadin  . Interstitial lung disease     Mild, DLCO of 68%    No past surgical history on file.  History   Social History  . Marital Status: Married    Spouse Name: N/A    Number of Children: N/A  . Years of Education: N/A   Occupational History  . Retired    Social History Main Topics  . Smoking status: Never Smoker   .  Smokeless tobacco: Never Used  . Alcohol Use: No  . Drug Use: No  . Sexually Active: Not on file   Other Topics Concern  . Not on file   Social History Narrative  . No narrative on file    No family history on file.  ROS: no nausea, vomiting; no fever, chills; no melena, hematochezia; no claudication  PHYSICAL EXAM: BP 131/82  Pulse 67  Ht 5\' 6"  (1.676 m)  Wt 130 lb (58.968 kg)  BMI 20.98 kg/m2 GENERAL: 78 year old female; NAD HEENT: NCAT, PERRLA, EOMI; sclera clear; no xanthelasma NECK: palpable bilateral carotid pulses, no bruits; no JVD; no TM LUNGS: CTA bilaterally CARDIAC: RRR (S1, S2); no significant murmurs; no rubs or gallops ABDOMEN: soft, non-tender; intact BS EXTREMETIES: no significant peripheral edema SKIN: warm/dry; no obvious rash/lesions MUSCULOSKELETAL: no joint deformity NEURO: no focal deficit; NL affect   EKG: reviewed and available in Electronic Records   ASSESSMENT & PLAN:  Atrial fibrillation Maintaining NSR on low-dose amiodarone. On full dose aspirin, given that she continues to decline Coumadin anticoagulation. Will order surveillance LFTs, TSH level, and PFTs with DLCO.  ATRIAL FLUTTER  Status post RF ablation  Interstitial lung disease We'll order surveillance PFTs with DLCO, as outlined above.  ESSENTIAL HYPERTENSION, BENIGN Stable on current medication regimen. Followed by primary M.D.    Gene Sade Hollon, PAC

## 2012-07-04 NOTE — Assessment & Plan Note (Signed)
Maintaining NSR on low-dose amiodarone. On full dose aspirin, given that she continues to decline Coumadin anticoagulation. Will order surveillance LFTs, TSH level, and PFTs with DLCO.

## 2012-07-04 NOTE — Patient Instructions (Addendum)
Your physician recommends that you schedule a follow-up appointment in: 6 months. You will receive a reminder letter in the mail in about 4 months reminding you to call and schedule your appointment. If you don't receive this letter, please contact our office. Your physician recommends that you continue on your current medications as directed. Please refer to the Current Medication list given to you today.  Your physician has recommended that you have a pulmonary function test. Pulmonary Function Tests are a group of tests that measure how well air moves in and out of your lungs. Your physician recommends that you return for lab work in today at [facility] for thyroid and liver function test.

## 2012-07-04 NOTE — Assessment & Plan Note (Signed)
Stable on current medication regimen. Followed by primary M.D.

## 2012-07-04 NOTE — Assessment & Plan Note (Signed)
We'll order surveillance PFTs with DLCO, as outlined above.

## 2012-07-04 NOTE — Assessment & Plan Note (Signed)
Status post RF ablation

## 2012-07-06 ENCOUNTER — Telehealth: Payer: Self-pay | Admitting: *Deleted

## 2012-07-06 NOTE — Telephone Encounter (Signed)
Patient informed. 

## 2012-07-06 NOTE — Telephone Encounter (Signed)
Message copied by Eustace Moore on Wed Jul 06, 2012  3:16 PM ------      Message from: Jonelle Sidle      Created: Tue Jul 05, 2012 10:51 AM       Normal LFTs and TSH on amiodarone.

## 2012-07-14 ENCOUNTER — Telehealth: Payer: Self-pay | Admitting: *Deleted

## 2012-07-14 NOTE — Telephone Encounter (Signed)
Patient informed. 

## 2012-07-14 NOTE — Telephone Encounter (Signed)
Message copied by Eustace Moore on Thu Jul 14, 2012 10:32 AM ------      Message from: MCDOWELL, Illene Bolus      Created: Wed Jul 13, 2012  8:40 AM       Reviewed. Actually these PFT results look better than the previous one, normal DLCO. Continue current medications.

## 2012-12-20 ENCOUNTER — Telehealth: Payer: Self-pay | Admitting: Physician Assistant

## 2012-12-20 NOTE — Telephone Encounter (Signed)
In general this depends on the comfort level of the dentist, and the complexity of the procedure planned. For a tooth extraction, she could probably continue aspirin as long as her dentist was comfortable with that.

## 2012-12-20 NOTE — Telephone Encounter (Signed)
Has to have tooth extracted tomorrow and needs to know what to do about her aspirin

## 2012-12-20 NOTE — Telephone Encounter (Signed)
Discussed below with patient.  Informed her that dentist could do this on ASA if he is comfortable, usually okay to stay on ASA and/or Coumadin as long as no more than 1-2 teeth.  States Dr. Freddi Starr told her to call.

## 2012-12-20 NOTE — Telephone Encounter (Signed)
Patient notified.  Info forwarded to Dr. Freddi Starr.

## 2013-05-22 ENCOUNTER — Ambulatory Visit (INDEPENDENT_AMBULATORY_CARE_PROVIDER_SITE_OTHER): Payer: Medicare Other | Admitting: Cardiology

## 2013-05-22 ENCOUNTER — Encounter: Payer: Self-pay | Admitting: Cardiology

## 2013-05-22 VITALS — BP 119/68 | HR 64 | Ht 66.0 in | Wt 126.0 lb

## 2013-05-22 DIAGNOSIS — I10 Essential (primary) hypertension: Secondary | ICD-10-CM

## 2013-05-22 DIAGNOSIS — J8410 Pulmonary fibrosis, unspecified: Secondary | ICD-10-CM

## 2013-05-22 DIAGNOSIS — I4891 Unspecified atrial fibrillation: Secondary | ICD-10-CM

## 2013-05-22 DIAGNOSIS — J849 Interstitial pulmonary disease, unspecified: Secondary | ICD-10-CM

## 2013-05-22 NOTE — Assessment & Plan Note (Signed)
Symptomatically well controlled on current regimen. She has consistently declined anticoagulation, remains on aspirin. Also continues on low-dose amiodarone. Recent ECG reviewed. PFTs done last year looked better than prior assessments.

## 2013-05-22 NOTE — Assessment & Plan Note (Signed)
By history. Last PFTs showed DLCO 87, normal FVC and FEV1.

## 2013-05-22 NOTE — Patient Instructions (Signed)
Continue all current medications. Your physician wants you to follow up in: 6 months.  You will receive a reminder letter in the mail one-two months in advance.  If you don't receive a letter, please call our office to schedule the follow up appointment   

## 2013-05-22 NOTE — Assessment & Plan Note (Signed)
Blood pressure well-controlled today. 

## 2013-05-22 NOTE — Progress Notes (Signed)
   Clinical Summary Ms. Erica [name] is an 79 y.o.female presenting for an office visit. She was last seen by Mr. Serpe PA-C in November 2013, a former patient of Dr. Andee Lineman. This is our first meeting in the office.  Records reviewed. She has declined anticoagulation over time. Was in sinus rhythm in the last visit on low-dose amiodarone and aspirin. PFTs from November of last year reviewed, looked better than prior assessments. ECG done in May of this year reviewed showing sinus rhythm as well. She reports only occasional palpitations, nothing prolonged.  She reports compliance with her medications.  No Known Allergies  Current Outpatient Prescriptions  Medication Sig Dispense Refill  . ALPRAZolam (XANAX) 0.25 MG tablet Take 0.125 mg by mouth 2 (two) times daily.       Marland Kitchen amiodarone (PACERONE) 200 MG tablet Take 100 mg by mouth daily.       Marland Kitchen aspirin 325 MG tablet Take 325 mg by mouth daily.        . Calcium Carbonate-Vitamin D (CALTRATE 600+D) 600-400 MG-UNIT per tablet Take 1 tablet by mouth 2 (two) times daily.       Marland Kitchen diltiazem (CARDIZEM CD) 120 MG 24 hr capsule Take 120 mg by mouth daily.        Marland Kitchen docusate sodium (COLACE) 100 MG capsule Take 100 mg by mouth 2 (two) times daily.      . hydrochlorothiazide (MICROZIDE) 12.5 MG capsule Take 12.5 mg by mouth daily.        Marland Kitchen lisinopril (PRINIVIL,ZESTRIL) 10 MG tablet Take 5 mg by mouth daily.       Marland Kitchen omeprazole (PRILOSEC) 20 MG capsule Take 20 mg by mouth 2 (two) times daily.       . promethazine (PHENERGAN) 25 MG tablet Take 25 mg by mouth every 6 (six) hours as needed.       No current facility-administered medications for this visit.    Past Medical History  Diagnosis Date  . Atrial flutter     Status post RFA  . Atrial fibrillation     Declines Coumadin  . Interstitial lung disease     Mild, DLCO of 68%    Social History Ms. Erica [name] reports that she has never smoked. She has never used smokeless tobacco. Ms. Erica [name] reports that she  does not drink alcohol.  Review of Systems Occasional back and neck pain, potentially arthritic. No angina symptoms based on description. The syncope. Otherwise negative.  Physical Examination Filed Vitals:   05/22/13 1501  BP: 119/68  Pulse: 64   Filed Weights   05/22/13 1501  Weight: 126 lb (57.153 kg)   Elderly woman, appears comfortable. HEENT: Conjunctiva and lids normal, oropharynx clear. Neck: Supple, no elevated JVP or carotid bruits, no thyromegaly. Lungs: Clear to auscultation, nonlabored breathing at rest. Cardiac: Regular rate and rhythm, no S3, soft systolic murmur. Abdomen: Soft, nontender, bowel sounds present. Extremities: No pitting edema, distal pulses 2+. Skin: Warm and dry. Musculoskeletal: Mild kyphosis. Neuropsychiatric: Alert and oriented x3, affect grossly appropriate.   Problem List and Plan   Atrial fibrillation Symptomatically well controlled on current regimen. She has consistently declined anticoagulation, remains on aspirin. Also continues on low-dose amiodarone. Recent ECG reviewed. PFTs done last year looked better than prior assessments.  Essential hypertension, benign Blood pressure well controlled today.  Interstitial lung disease By history. Last PFTs showed DLCO 87, normal FVC and FEV1.    Jonelle Sidle, M.D., F.A.C.C.

## 2013-06-27 ENCOUNTER — Encounter: Payer: Self-pay | Admitting: Cardiology

## 2013-11-22 ENCOUNTER — Encounter: Payer: Self-pay | Admitting: Cardiology

## 2013-11-22 ENCOUNTER — Ambulatory Visit (INDEPENDENT_AMBULATORY_CARE_PROVIDER_SITE_OTHER): Payer: Medicare Other | Admitting: Cardiology

## 2013-11-22 VITALS — BP 137/79 | HR 65 | Ht 67.0 in | Wt 127.1 lb

## 2013-11-22 DIAGNOSIS — J8410 Pulmonary fibrosis, unspecified: Secondary | ICD-10-CM

## 2013-11-22 DIAGNOSIS — Z01810 Encounter for preprocedural cardiovascular examination: Secondary | ICD-10-CM | POA: Insufficient documentation

## 2013-11-22 DIAGNOSIS — I4891 Unspecified atrial fibrillation: Secondary | ICD-10-CM

## 2013-11-22 DIAGNOSIS — I10 Essential (primary) hypertension: Secondary | ICD-10-CM

## 2013-11-22 DIAGNOSIS — J849 Interstitial pulmonary disease, unspecified: Secondary | ICD-10-CM

## 2013-11-22 NOTE — Progress Notes (Signed)
Clinical Summary Erica [name] is an 80 y.o.female last seen in September 2014. She continues to do very well without any recurrent palpitations on medical therapy. She tells me that she is being considered for elective hernia surgery with Dr. Marcha Solders in April.  She has declined anticoagulation, continues on aspirin and amiodarone for rhythm control. Last PFTs were in November 2013 (DLCO was normal at that time). LFTs normal as of October 2014.  She reports no angina, no progressive shortness of breath, no syncope. Describes activities at around 4 METs without major functional limitation.  She is already scheduled for a preoperative anesthesia visit on April 10 at [facility].    No Known Allergies  Current Outpatient Prescriptions  Medication Sig Dispense Refill  . ALPRAZolam (XANAX) 0.25 MG tablet Take 0.125 mg by mouth 2 (two) times daily.       Marland Kitchen amiodarone (PACERONE) 200 MG tablet Take 100 mg by mouth daily.       Marland Kitchen aspirin 325 MG tablet Take 325 mg by mouth daily.        . Calcium Carbonate-Vitamin D (CALTRATE 600+D) 600-400 MG-UNIT per tablet Take 1 tablet by mouth 2 (two) times daily.       Marland Kitchen diltiazem (CARDIZEM CD) 120 MG 24 hr capsule Take 120 mg by mouth daily.        Marland Kitchen docusate sodium (COLACE) 100 MG capsule Take 100 mg by mouth 2 (two) times daily.      . hydrochlorothiazide (MICROZIDE) 12.5 MG capsule Take 12.5 mg by mouth daily.        Marland Kitchen lisinopril (PRINIVIL,ZESTRIL) 10 MG tablet Take 5 mg by mouth daily.       . Multiple Vitamin (MULTIVITAMIN) tablet Take 1 tablet by mouth daily.      Marland Kitchen omeprazole (PRILOSEC) 20 MG capsule Take 20 mg by mouth 2 (two) times daily.       . promethazine (PHENERGAN) 25 MG tablet Take 25 mg by mouth every 6 (six) hours as needed.      . solifenacin (VESICARE) 5 MG tablet Take 2.5 mg by mouth daily.       No current facility-administered medications for this visit.    Past Medical History  Diagnosis Date  . Atrial flutter     Status post RFA    . Atrial fibrillation     Declines Coumadin  . Interstitial lung disease     Mild, DLCO of 68%    Social History Ms. Palacios reports that she has never smoked. She has never used smokeless tobacco. Ms. Macneill reports that she does not drink alcohol.  Review of Systems States that her bladder is "down." Has had previous bladder tack and hysterectomy. Had urological evaluation recently as well. Negative except as outlined.  Physical Examination Filed Vitals:   11/22/13 1358  BP: 137/79  Pulse: 65   Filed Weights   11/22/13 1358  Weight: 127 lb 1.9 oz (57.661 kg)    Elderly woman, appears comfortable.  HEENT: Conjunctiva and lids normal, oropharynx clear.  Neck: Supple, no elevated JVP or carotid bruits, no thyromegaly.  Lungs: Clear to auscultation, nonlabored breathing at rest.  Cardiac: Regular rate and rhythm, no S3, soft systolic murmur.  Abdomen: Soft, nontender, bowel sounds present.  Extremities: No pitting edema, distal pulses 2+.  Skin: Warm and dry.  Musculoskeletal: Mild kyphosis.  Neuropsychiatric: Alert and oriented x3, affect grossly appropriate.   Problem List and Plan   Atrial fibrillation Maintaining sinus rhythm on current  regimen. As noted above, she has declined anticoagulation. We continue aspirin, Cardizem CD, and amiodarone.  Essential hypertension, benign Reasonable blood pressure today. No change to lisinopril.  Interstitial lung disease Based on prior assessment. Overall mild with DLCO 60% in the past, although near normal by her most recent PFTs.  Preoperative cardiovascular examination Patient being considered for elective hernia surgery. She tells me that she [facility], should get a preoperative ECG at that time. She describes activities at around 4 METS without major functional limitation. I expect that she should be able to proceed at an acceptable perioperative cardiac risk. Reasonable  to hold aspirin perioperatively as needed. Would however continue her amiodarone and Cardizem CD, monitor heart rhythm in the perioperative setting.    Jonelle SidleSamuel G. McDowell, M.D., F.A.C.C.

## 2013-11-22 NOTE — Patient Instructions (Signed)

## 2013-11-22 NOTE — Assessment & Plan Note (Addendum)
Maintaining sinus rhythm on current regimen. As noted above, she has declined anticoagulation. We continue aspirin, Cardizem CD, and amiodarone.

## 2013-11-22 NOTE — Assessment & Plan Note (Signed)
Patient being considered for elective hernia surgery. She tells me that she has a preoperative [facility] in early [facility], should get a preoperative ECG at that time. She describes activities at around 4 METS without major functional limitation. I expect that she should be able to proceed at an acceptable perioperative cardiac risk. Reasonable to hold aspirin perioperatively as needed. Would however continue her amiodarone and Cardizem CD, monitor heart rhythm in the perioperative setting.

## 2013-11-22 NOTE — Assessment & Plan Note (Signed)
Reasonable blood pressure today. No change to lisinopril.

## 2013-11-22 NOTE — Assessment & Plan Note (Signed)
Based on prior assessment. Overall mild with DLCO 60% in the past, although near normal by her most recent PFTs.

## 2013-12-11 ENCOUNTER — Encounter: Payer: Self-pay | Admitting: Cardiology

## 2014-01-15 HISTORY — PX: LAPAROSCOPIC INGUINAL HERNIA REPAIR: SUR788

## 2014-05-18 ENCOUNTER — Encounter: Payer: Self-pay | Admitting: *Deleted

## 2014-05-21 ENCOUNTER — Ambulatory Visit (INDEPENDENT_AMBULATORY_CARE_PROVIDER_SITE_OTHER): Payer: Medicare Other | Admitting: Cardiology

## 2014-05-21 ENCOUNTER — Encounter: Payer: Self-pay | Admitting: Cardiology

## 2014-05-21 VITALS — BP 139/82 | HR 74 | Ht 66.0 in | Wt 126.0 lb

## 2014-05-21 DIAGNOSIS — J849 Interstitial pulmonary disease, unspecified: Secondary | ICD-10-CM

## 2014-05-21 DIAGNOSIS — I4892 Unspecified atrial flutter: Secondary | ICD-10-CM

## 2014-05-21 DIAGNOSIS — I4891 Unspecified atrial fibrillation: Secondary | ICD-10-CM

## 2014-05-21 DIAGNOSIS — I10 Essential (primary) hypertension: Secondary | ICD-10-CM

## 2014-05-21 DIAGNOSIS — I480 Paroxysmal atrial fibrillation: Secondary | ICD-10-CM

## 2014-05-21 DIAGNOSIS — J8410 Pulmonary fibrosis, unspecified: Secondary | ICD-10-CM

## 2014-05-21 NOTE — Assessment & Plan Note (Signed)
No change in current antihypertensive regimen. 

## 2014-05-21 NOTE — Assessment & Plan Note (Signed)
Symptomatically well controlled, maintaining sinus rhythm by ECG today. Continue low-dose amiodarone, also on aspirin since she declines anticoagulation.

## 2014-05-21 NOTE — Progress Notes (Signed)
Clinical Summary Erica [name] is an 80 y.o.female last seen in March. She is here with her daughter. Since I last saw her she had hernia surgery, no cardiac complications. She tells me that about a month later she had a fall at home (lost her balance without syncope) and had to have right arm surgery at [facility]. She is nearly back to baseline, back at home, having completed inpatient rehabilitation.  She is not aware of any recurring PAF, has had no palpitations or chest pain. ECG today shows sinus rhythm.  Lab work from April showed BUN 16, creatinine 0.7, normal LFTs, potassium 3.7, hemoglobin 12.1, platelets 208.  No Known Allergies  Current Outpatient Prescriptions  Medication Sig Dispense Refill  . ALPRAZolam (XANAX) 0.25 MG tablet Take 0.125 mg by mouth 2 (two) times daily.       Marland Kitchen amiodarone (PACERONE) 200 MG tablet Take 100 mg by mouth daily.       Marland Kitchen aspirin 325 MG tablet Take 325 mg by mouth daily.        . Calcium Carbonate-Vitamin D (CALTRATE 600+D) 600-400 MG-UNIT per tablet Take 1 tablet by mouth 2 (two) times daily.       Marland Kitchen diltiazem (CARDIZEM CD) 120 MG 24 hr capsule Take 120 mg by mouth daily.        Marland Kitchen docusate sodium (COLACE) 100 MG capsule Take 100 mg by mouth 2 (two) times daily.      . hydrochlorothiazide (MICROZIDE) 12.5 MG capsule Take 12.5 mg by mouth daily.        Marland Kitchen lisinopril (PRINIVIL,ZESTRIL) 10 MG tablet Take 5 mg by mouth daily.       . Multiple Vitamin (MULTIVITAMIN) tablet Take 1 tablet by mouth daily.      Marland Kitchen omeprazole (PRILOSEC) 20 MG capsule Take 20 mg by mouth 2 (two) times daily.       . promethazine (PHENERGAN) 25 MG tablet Take 25 mg by mouth every 6 (six) hours as needed.       No current facility-administered medications for this visit.    Past Medical History  Diagnosis Date  . Atrial flutter     Status post RFA  . Atrial fibrillation     Declines Coumadin  . Interstitial lung disease     Mild, DLCO of 68%    Social History Erica [name]  reports that she has never smoked. She has never used smokeless tobacco. Erica [name] reports that she does not drink alcohol.  Review of Systems Other systems reviewed and negative except as outlined.  Physical Examination Filed Vitals:   05/21/14 1039  BP: 139/82  Pulse: 74   Filed Weights   05/21/14 1039  Weight: 126 lb (57.153 kg)    Elderly woman, appears comfortable.  HEENT: Conjunctiva and lids normal, oropharynx clear.  Neck: Supple, no elevated JVP or carotid bruits, no thyromegaly.  Lungs: Clear to auscultation, nonlabored breathing at rest.  Cardiac: Regular rate and rhythm, no S3, soft systolic murmur.  Abdomen: Soft, nontender, bowel sounds present.  Extremities: No pitting edema, distal pulses 2+.  Skin: Warm and dry.  Musculoskeletal: Mild kyphosis.  Neuropsychiatric: Alert and oriented x3, affect grossly appropriate.   Problem List and Plan   Atrial fibrillation Symptomatically well controlled, maintaining sinus rhythm by ECG today. Continue low-dose amiodarone, also on aspirin since she declines anticoagulation.  Essential hypertension, benign No change in current antihypertensive regimen.  Interstitial lung disease Based on prior assessment. Overall mild with DLCO 60% in  the past, although near normal by her most recent PFTs.    Jonelle Sidle, M.D., F.A.C.C.

## 2014-05-21 NOTE — Patient Instructions (Signed)

## 2014-05-21 NOTE — Assessment & Plan Note (Signed)
Based on prior assessment. Overall mild with DLCO 60% in the past, although near normal by her most recent PFTs.

## 2014-11-21 ENCOUNTER — Ambulatory Visit (INDEPENDENT_AMBULATORY_CARE_PROVIDER_SITE_OTHER): Payer: Medicare Other | Admitting: Cardiology

## 2014-11-21 ENCOUNTER — Encounter: Payer: Self-pay | Admitting: Cardiology

## 2014-11-21 ENCOUNTER — Encounter: Payer: Self-pay | Admitting: *Deleted

## 2014-11-21 VITALS — BP 128/68 | HR 75 | Ht 63.0 in | Wt 119.8 lb

## 2014-11-21 DIAGNOSIS — I480 Paroxysmal atrial fibrillation: Secondary | ICD-10-CM

## 2014-11-21 NOTE — Patient Instructions (Signed)
Your physician wants you to follow-up in: 6 months with Dr. Randa SpikeMcDowell You will receive a reminder letter in the mail two months in advance. If you don't receive a letter, please call our office to schedule the follow-up appointment.  Your physician recommends that you continue on your current medications as directed. Please refer to the Current Medication list given to you today.  [facility]  Thank you for choosing [facility]!!

## 2014-11-21 NOTE — Progress Notes (Signed)
Cardiology Office Note  Date: 11/21/2014   ID: Erica [name], DOB 12/11/1927, MRN [id number]  PCP: Kirstie Peri, MD  Primary Cardiologist: Nona Dell, MD   Chief Complaint  Patient presents with  . Atrial arrhythmias    History of Present Illness: Erica [name] is an 81 y.o. female last seen in September 2015. She presents for a routine visit. From a cardiac perspective she has been stable, describes occasional brief palpitations, no chest pain. She continues on low-dose amiodarone.  She has had some trouble with congestion and cough, just recently placed on a course of steroids and antibiotics by Dr. Sherryll Burger. She tells me that she did have additional lab work a few months ago, which we will request.  She continues on aspirin, has consistently declined anticoagulation. Heart rate is regular today.   Past Medical History  Diagnosis Date  . Atrial flutter     Status post RFA  . Atrial fibrillation     Declines Coumadin  . Interstitial lung disease     Mild, DLCO of 68%    Past Surgical History  Procedure Laterality Date  . Laparoscopic inguinal hernia repair Right 01/15/2014    Current Outpatient Prescriptions  Medication Sig Dispense Refill  . ALPRAZolam (XANAX) 0.25 MG tablet Take 0.125 mg by mouth 2 (two) times daily.     Marland Kitchen amiodarone (PACERONE) 200 MG tablet Take 100 mg by mouth daily.     Marland Kitchen aspirin 325 MG tablet Take 325 mg by mouth daily.      . Calcium Carbonate-Vitamin D (CALTRATE 600+D) 600-400 MG-UNIT per tablet Take 1 tablet by mouth 2 (two) times daily.     Marland Kitchen diltiazem (CARDIZEM CD) 120 MG 24 hr capsule Take 120 mg by mouth daily.      Marland Kitchen docusate sodium (COLACE) 100 MG capsule Take 100 mg by mouth 2 (two) times daily.    . hydrochlorothiazide (MICROZIDE) 12.5 MG capsule Take 12.5 mg by mouth daily.      Marland Kitchen lisinopril (PRINIVIL,ZESTRIL) 10 MG tablet Take 5 mg by mouth daily.     . Multiple Vitamin (MULTIVITAMIN) tablet Take 1 tablet by mouth daily.     Marland Kitchen omeprazole (PRILOSEC) 20 MG capsule Take 20 mg by mouth 2 (two) times daily.     . promethazine (PHENERGAN) 25 MG tablet Take 25 mg by mouth every 6 (six) hours as needed.     No current facility-administered medications for this visit.    Allergies:  Review of patient's allergies indicates no known allergies.   Social History: The patient  reports that she has never smoked. She has never used smokeless tobacco. She reports that she does not drink alcohol or use illicit drugs.   ROS:  Please see the history of present illness. Otherwise, complete review of systems is positive for none.  All other systems are reviewed and negative.   Physical Exam: VS:  BP 128/68 mmHg  Pulse 75  Ht  (1.6 m)  Wt 119 lb 12.8 oz (54.341 kg)  BMI 21.23 kg/m2  SpO2 96%, BMI Body mass index is 21.23 kg/(m^2).  Wt Readings from Last 3 Encounters:  11/21/14 119 lb 12.8 oz (54.341 kg)  05/21/14 126 lb (57.153 kg)  11/22/13 127 lb 1.9 oz (57.661 kg)     Elderly woman, appears comfortable.  HEENT: Conjunctiva and lids normal, oropharynx clear.  Neck: Supple, no elevated JVP or carotid bruits, no thyromegaly.  Lungs: Clear to auscultation, nonlabored breathing at rest.  Cardiac:  Regular rate and rhythm, no S3, soft systolic murmur.  Abdomen: Soft, nontender, bowel sounds present.  Extremities: No pitting edema, distal pulses 2+.  Skin: Warm and dry.  Musculoskeletal: Mild kyphosis.  Neuropsychiatric: Alert and oriented x3, affect grossly appropriate.   ECG: ECG is not ordered today.   Recent Labwork:  12/08/2013 BUN 16, creatinine 0.7, AST 19, ALT 12, potassium 3.7, hemoglobin 12.1, platelets 208.  Other Studies Reviewed Today:  Echocardiogram ([facility]) 02/11/2009 reported LVEF 55-60% with diastolic dysfunction, MAC with trivial mitral regurgitation, no aortic stenosis, trace tricuspid regurgitation with RVSP 32 mmHg, mild right atrial enlargement.  Assessment and Plan:  1.  Paroxysmal atrial fibrillation, symptomatic at well controlled on low-dose amiodarone. Plan to request interval lab work from Dr. Sherryll BurgerShah. She continues on aspirin, has declined anticoagulation over time.  2. Recent chest congestion and cough, undergoing management by Dr. Sherryll BurgerShah.  Current medicines were reviewed with the patient today.   Disposition: FU with me in 6 months.   Signed, Jonelle SidleSamuel G. McDowell, MD, [facility] 11/21/2014 1:46 PM    [facility] [address], [place], [place] [address] Phone: [phone]; Fax: [phone]) [phone]

## 2015-05-07 ENCOUNTER — Encounter: Payer: Self-pay | Admitting: *Deleted

## 2015-05-07 ENCOUNTER — Encounter: Payer: Self-pay | Admitting: Cardiology

## 2015-05-07 ENCOUNTER — Ambulatory Visit (INDEPENDENT_AMBULATORY_CARE_PROVIDER_SITE_OTHER): Payer: Medicare Other | Admitting: Cardiology

## 2015-05-07 VITALS — BP 117/66 | HR 69 | Ht 63.0 in | Wt 116.4 lb

## 2015-05-07 DIAGNOSIS — I4892 Unspecified atrial flutter: Secondary | ICD-10-CM

## 2015-05-07 DIAGNOSIS — I4891 Unspecified atrial fibrillation: Secondary | ICD-10-CM | POA: Diagnosis not present

## 2015-05-07 DIAGNOSIS — I480 Paroxysmal atrial fibrillation: Secondary | ICD-10-CM

## 2015-05-07 NOTE — Progress Notes (Signed)
Cardiology Office Note  Date: 05/07/2015   ID: Erica [name], DOB [birth year], MRN [id number]  PCP: Kirstie Peri, MD  Primary Cardiologist: Nona Dell, MD   Chief Complaint  Patient presents with  . Atrial arrhythmias    History of Present Illness: Erica [name] is an 81 y.o. female last seen in March. She presents today for a routine follow-up visit. No report of palpitations or chest pain. She has had difficulty tolerating the high heat and humidity this summer. Reports no leg edema or orthopnea.  We reviewed her medications are outlined below. She continues to follow with Dr. Sherryll Burger, requesting most recent lab work.  ECG is reviewed today showing sinus rhythm with IVCD and nonspecific ST changes.   Past Medical History  Diagnosis Date  . Atrial flutter     Status post RFA  . Atrial fibrillation     Declines Coumadin  . Interstitial lung disease     Mild, DLCO of 68%    Current Outpatient Prescriptions  Medication Sig Dispense Refill  . ALPRAZolam (XANAX) 0.25 MG tablet Take 0.125 mg by mouth 2 (two) times daily.     Marland Kitchen amiodarone (PACERONE) 200 MG tablet Take 100 mg by mouth daily.     Marland Kitchen aspirin 325 MG tablet Take 325 mg by mouth daily.      . Calcium Carbonate-Vitamin D (CALTRATE 600+D) 600-400 MG-UNIT per tablet Take 1 tablet by mouth 2 (two) times daily.     Marland Kitchen diltiazem (CARDIZEM CD) 120 MG 24 hr capsule Take 120 mg by mouth daily.      Marland Kitchen docusate sodium (COLACE) 100 MG capsule Take 100 mg by mouth 2 (two) times daily.    . hydrochlorothiazide (MICROZIDE) 12.5 MG capsule Take 12.5 mg by mouth daily.      Marland Kitchen lisinopril (PRINIVIL,ZESTRIL) 10 MG tablet Take 5 mg by mouth daily.     . Multiple Vitamin (MULTIVITAMIN) tablet Take 1 tablet by mouth daily.    Marland Kitchen omeprazole (PRILOSEC) 20 MG capsule Take 20 mg by mouth 2 (two) times daily.     . promethazine (PHENERGAN) 25 MG tablet Take 25 mg by mouth every 6 (six) hours as needed.     No current  facility-administered medications for this visit.    Allergies:  Review of patient's allergies indicates no known allergies.   Social History: The patient  reports that she has never smoked. She has never used smokeless tobacco. She reports that she does not drink alcohol or use illicit drugs.   ROS:  Please see the history of present illness. Otherwise, complete review of systems is positive for arthritic pains. Reports changing mole on her left back, plans to see a dermatologist. All other systems are reviewed and negative.   Physical Exam: VS:  BP 117/66 mmHg  Pulse 69  Ht  (1.6 m)  Wt 116 lb 6.4 oz (52.799 kg)  BMI 20.62 kg/m2  SpO2 96%, BMI Body mass index is 20.62 kg/(m^2).  Wt Readings from Last 3 Encounters:  05/07/15 116 lb 6.4 oz (52.799 kg)  11/21/14 119 lb 12.8 oz (54.341 kg)  05/21/14 126 lb (57.153 kg)     Elderly woman, appears comfortable.  HEENT: Conjunctiva and lids normal, oropharynx clear.  Neck: Supple, no elevated JVP or carotid bruits, no thyromegaly.  Lungs: Clear to auscultation, nonlabored breathing at rest.  Cardiac: Regular rate and rhythm, no S3, soft systolic murmur.  Abdomen: Soft, nontender, bowel sounds present.  Extremities: No pitting edema,  distal pulses 2+.  Skin: Warm and dry.  Musculoskeletal: Mild kyphosis.  Neuropsychiatric: Alert and oriented x3, affect grossly appropriate.   ECG: ECG is ordered today.   Recent Labwork:  12/08/2013: BUN 16, current 0.7, AST 19, ALT 12, potassium 3.7, hemoglobin 12.1, platelets 208.  Other Studies Reviewed Today:  Echocardiogram ([facility]) 02/11/2009 reported LVEF 55-60% with diastolic dysfunction, MAC with trivial mitral regurgitation, no aortic stenosis, trace tricuspid regurgitation with RVSP 32 mmHg, mild right atrial enlargement.  Assessment and Plan:  1. Paroxysmal atrial fibrillation, symptomatically well controlled. She continues on Cardizem CD, amiodarone, and aspirin,  having declined anticoagulation. Requesting most recent work lab work from Dr. Sherryll Burger for review.  2. History of atrial flutter status post radiofrequency ablation.  Current medicines were reviewed with the patient today.   Orders Placed This Encounter  Procedures  . EKG 12-Lead    Disposition: FU with me in 6 months.   Signed, Jonelle Sidle, MD, [facility] 05/07/2015 3:05 PM    [facility] at [facility] [address] [place], [place], [place] [address] Phone: [phone]; Fax: [phone]

## 2015-05-07 NOTE — Patient Instructions (Signed)
Your physician recommends that you continue on your current medications as directed. Please refer to the Current Medication list given to you today. Your physician recommends that you schedule a follow-up appointment in: 6 months. You will receive a reminder letter in the mail in about 4 months reminding you to call and schedule your appointment. If you don't receive this letter, please contact our office. 

## 2015-05-22 ENCOUNTER — Ambulatory Visit: Payer: PRIVATE HEALTH INSURANCE | Admitting: Cardiology

## 2015-09-03 DIAGNOSIS — I739 Peripheral vascular disease, unspecified: Secondary | ICD-10-CM | POA: Diagnosis not present

## 2015-09-05 DIAGNOSIS — H353211 Exudative age-related macular degeneration, right eye, with active choroidal neovascularization: Secondary | ICD-10-CM | POA: Diagnosis not present

## 2015-09-05 DIAGNOSIS — H353122 Nonexudative age-related macular degeneration, left eye, intermediate dry stage: Secondary | ICD-10-CM | POA: Diagnosis not present

## 2015-10-07 DIAGNOSIS — Z1231 Encounter for screening mammogram for malignant neoplasm of breast: Secondary | ICD-10-CM | POA: Diagnosis not present

## 2015-10-07 DIAGNOSIS — N905 Atrophy of vulva: Secondary | ICD-10-CM | POA: Diagnosis not present

## 2015-10-07 DIAGNOSIS — R109 Unspecified abdominal pain: Secondary | ICD-10-CM | POA: Diagnosis not present

## 2015-10-07 DIAGNOSIS — R3915 Urgency of urination: Secondary | ICD-10-CM | POA: Diagnosis not present

## 2015-10-10 DIAGNOSIS — I10 Essential (primary) hypertension: Secondary | ICD-10-CM | POA: Diagnosis not present

## 2015-10-10 DIAGNOSIS — E860 Dehydration: Secondary | ICD-10-CM | POA: Diagnosis not present

## 2015-10-10 DIAGNOSIS — R1084 Generalized abdominal pain: Secondary | ICD-10-CM | POA: Diagnosis not present

## 2015-10-10 DIAGNOSIS — J449 Chronic obstructive pulmonary disease, unspecified: Secondary | ICD-10-CM | POA: Diagnosis not present

## 2015-10-10 DIAGNOSIS — R112 Nausea with vomiting, unspecified: Secondary | ICD-10-CM | POA: Diagnosis not present

## 2015-10-10 DIAGNOSIS — K219 Gastro-esophageal reflux disease without esophagitis: Secondary | ICD-10-CM | POA: Diagnosis not present

## 2015-10-10 DIAGNOSIS — L89151 Pressure ulcer of sacral region, stage 1: Secondary | ICD-10-CM | POA: Diagnosis not present

## 2015-10-10 DIAGNOSIS — K5720 Diverticulitis of large intestine with perforation and abscess without bleeding: Secondary | ICD-10-CM | POA: Diagnosis not present

## 2015-10-10 DIAGNOSIS — Z7982 Long term (current) use of aspirin: Secondary | ICD-10-CM | POA: Diagnosis not present

## 2015-10-10 DIAGNOSIS — Z79899 Other long term (current) drug therapy: Secondary | ICD-10-CM | POA: Diagnosis not present

## 2015-10-10 DIAGNOSIS — K6389 Other specified diseases of intestine: Secondary | ICD-10-CM | POA: Diagnosis not present

## 2015-10-10 DIAGNOSIS — M1990 Unspecified osteoarthritis, unspecified site: Secondary | ICD-10-CM | POA: Diagnosis not present

## 2015-10-23 DIAGNOSIS — H353211 Exudative age-related macular degeneration, right eye, with active choroidal neovascularization: Secondary | ICD-10-CM | POA: Diagnosis not present

## 2015-11-04 ENCOUNTER — Encounter: Payer: Self-pay | Admitting: Cardiology

## 2015-11-04 ENCOUNTER — Encounter: Payer: Self-pay | Admitting: *Deleted

## 2015-11-04 ENCOUNTER — Ambulatory Visit (INDEPENDENT_AMBULATORY_CARE_PROVIDER_SITE_OTHER): Payer: Medicare Other | Admitting: Cardiology

## 2015-11-04 VITALS — BP 128/70 | HR 68 | Ht 63.0 in | Wt 108.8 lb

## 2015-11-04 DIAGNOSIS — Z8679 Personal history of other diseases of the circulatory system: Secondary | ICD-10-CM | POA: Diagnosis not present

## 2015-11-04 DIAGNOSIS — I480 Paroxysmal atrial fibrillation: Secondary | ICD-10-CM | POA: Diagnosis not present

## 2015-11-04 NOTE — Patient Instructions (Signed)
Your physician wants you to follow-up in: 6 months with Dr. McDowell You will receive a reminder letter in the mail two months in advance. If you don't receive a letter, please call our office to schedule the follow-up appointment.  Your physician recommends that you continue on your current medications as directed. Please refer to the Current Medication list given to you today.  Thank you for choosing [facility]!!    

## 2015-11-04 NOTE — Progress Notes (Signed)
Cardiology Office Note  Date: 11/04/2015   ID: Erica [name] [place], DOB Aug 02, 1928, MRN [id number]  PCP: Kirstie PeriSHAH,ASHISH, MD  Primary Cardiologist: Nona DellSamuel Jayona Mccaig, MD   Chief Complaint  Patient presents with  . PAF    History of Present Illness: Erica [name] Prats is an 82 y.o. female last seen in September 2016. She is here today with her daughter for a follow-up visit. Since last encounter she has had no significant progression in palpitations, no exertional chest pain. We reviewed her medications which are outlined below. She continues on amiodarone, Cardizem CD, and aspirin.  She will be having a follow-up physical with Dr. Sherryll BurgerShah including lab work in the next few weeks.  Past Medical History  Diagnosis Date  . Atrial flutter (HCC)     Status post RFA  . Atrial fibrillation (HCC)     Declines Coumadin  . Interstitial lung disease (HCC)     Mild, DLCO of 68%    Current Outpatient Prescriptions  Medication Sig Dispense Refill  . ALPRAZolam (XANAX) 0.25 MG tablet Take 0.125 mg by mouth 2 (two) times daily.     Marland Kitchen. amiodarone (PACERONE) 200 MG tablet Take 100 mg by mouth daily.     Marland Kitchen. aspirin 325 MG tablet Take 325 mg by mouth daily.      . Calcium Carbonate-Vitamin D (CALTRATE 600+D) 600-400 MG-UNIT per tablet Take 1 tablet by mouth 2 (two) times daily.     Marland Kitchen. diltiazem (CARDIZEM CD) 120 MG 24 hr capsule Take 120 mg by mouth daily.      Marland Kitchen. docusate sodium (COLACE) 100 MG capsule Take 100 mg by mouth 2 (two) times daily.    . hydrochlorothiazide (MICROZIDE) 12.5 MG capsule Take 12.5 mg by mouth daily.      Marland Kitchen. lisinopril (PRINIVIL,ZESTRIL) 10 MG tablet Take 5 mg by mouth daily.     . Multiple Vitamin (MULTIVITAMIN) tablet Take 1 tablet by mouth daily.    Marland Kitchen. omeprazole (PRILOSEC) 20 MG capsule Take 20 mg by mouth 2 (two) times daily.     Bertram Gala. Polyethyl Glycol-Propyl Glycol (SYSTANE OP) Apply 2 drops to eye daily.    . promethazine (PHENERGAN) 25 MG tablet Take 25 mg by mouth every 6 (six)  hours as needed.     No current facility-administered medications for this visit.   Allergies:  Ciprofloxacin   Social History: The patient  reports that she has never smoked. She has never used smokeless tobacco. She reports that she does not drink alcohol or use illicit drugs.   ROS:  Please see the history of present illness. Otherwise, complete review of systems is positive for chronic back pain and right hip pain.  All other systems are reviewed and negative.   Physical Exam: VS:  BP 128/70 mmHg  Pulse 68  Ht 5\' 3"  (1.6 m)  Wt 108 lb 12.8 oz (49.351 kg)  BMI 19.28 kg/m2  SpO2 99%, BMI Body mass index is 19.28 kg/(m^2).  Wt Readings from Last 3 Encounters:  11/04/15 108 lb 12.8 oz (49.351 kg)  05/07/15 116 lb 6.4 oz (52.799 kg)  11/21/14 119 lb 12.8 oz (54.341 kg)    Elderly woman, appears comfortable.  HEENT: Conjunctiva and lids normal, oropharynx clear.  Neck: Supple, no elevated JVP or carotid bruits, no thyromegaly.  Lungs: Clear to auscultation, nonlabored breathing at rest.  Cardiac: Regular rate and rhythm, no S3, soft systolic murmur.  Abdomen: Soft, nontender, bowel sounds present.  Extremities: No pitting edema, distal pulses  2+.   ECG: I personally reviewed the prior tracing from 05/07/2015 which showed normal sinus rhythm with IVCD and nonspecific [name]-wave changes.  Recent Labwork:  July 2016: TSH 2.06 December 2013: BUN 16, creatinine 0.8, AST 19, ALT 12, potassium 3.7, hemoglobin 12.1, platelets 208  Other Studies Reviewed Today:  Echocardiogram ([facility]) 02/11/2009: LVEF 55-60% with diastolic dysfunction, MAC with trivial mitral regurgitation, no aortic stenosis, trace tricuspid regurgitation with RVSP 32 mmHg, mild right atrial enlargement.  Assessment and Plan:  1. Paroxysmal atrial fibrillation, symptomatically stable on current medical regimen. She will be having follow-up lab work with Dr. Sherryll Burger with physical in the next few weeks. Continue  amiodarone, Cardizem CD, and aspirin. She has declined anticoagulation over time.  2. Atrial flutter status post radiofrequency ablation by Dr. Johney Frame back in 2010.  Current medicines were reviewed with the patient today.  Disposition: FU with me in 6 months.   Signed, Jonelle Sidle, MD, [facility] 11/04/2015 1:48 PM     [facility] at [facility] [address] [place], [place], [place] [address] Phone: [phone]; Fax: [phone]

## 2015-11-08 DIAGNOSIS — F419 Anxiety disorder, unspecified: Secondary | ICD-10-CM | POA: Diagnosis not present

## 2015-11-08 DIAGNOSIS — Z681 Body mass index (BMI) 19 or less, adult: Secondary | ICD-10-CM | POA: Diagnosis not present

## 2015-11-08 DIAGNOSIS — Z789 Other specified health status: Secondary | ICD-10-CM | POA: Diagnosis not present

## 2015-11-08 DIAGNOSIS — E7800 Pure hypercholesterolemia, unspecified: Secondary | ICD-10-CM | POA: Diagnosis not present

## 2015-11-12 DIAGNOSIS — I739 Peripheral vascular disease, unspecified: Secondary | ICD-10-CM | POA: Diagnosis not present

## 2015-12-05 DIAGNOSIS — H353211 Exudative age-related macular degeneration, right eye, with active choroidal neovascularization: Secondary | ICD-10-CM | POA: Diagnosis not present

## 2015-12-09 DIAGNOSIS — H01025 Squamous blepharitis left lower eyelid: Secondary | ICD-10-CM | POA: Diagnosis not present

## 2015-12-09 DIAGNOSIS — H01022 Squamous blepharitis right lower eyelid: Secondary | ICD-10-CM | POA: Diagnosis not present

## 2015-12-09 DIAGNOSIS — H01024 Squamous blepharitis left upper eyelid: Secondary | ICD-10-CM | POA: Diagnosis not present

## 2015-12-09 DIAGNOSIS — H401132 Primary open-angle glaucoma, bilateral, moderate stage: Secondary | ICD-10-CM | POA: Diagnosis not present

## 2015-12-12 DIAGNOSIS — M25552 Pain in left hip: Secondary | ICD-10-CM | POA: Diagnosis not present

## 2015-12-12 DIAGNOSIS — E876 Hypokalemia: Secondary | ICD-10-CM | POA: Diagnosis not present

## 2015-12-12 DIAGNOSIS — I10 Essential (primary) hypertension: Secondary | ICD-10-CM | POA: Diagnosis not present

## 2015-12-12 DIAGNOSIS — K451 Other specified abdominal hernia with gangrene: Secondary | ICD-10-CM | POA: Diagnosis not present

## 2015-12-12 DIAGNOSIS — R1084 Generalized abdominal pain: Secondary | ICD-10-CM | POA: Diagnosis not present

## 2015-12-12 DIAGNOSIS — R109 Unspecified abdominal pain: Secondary | ICD-10-CM | POA: Diagnosis not present

## 2015-12-12 DIAGNOSIS — N390 Urinary tract infection, site not specified: Secondary | ICD-10-CM | POA: Diagnosis not present

## 2015-12-12 DIAGNOSIS — I4892 Unspecified atrial flutter: Secondary | ICD-10-CM | POA: Diagnosis not present

## 2015-12-12 DIAGNOSIS — E860 Dehydration: Secondary | ICD-10-CM | POA: Diagnosis not present

## 2015-12-12 DIAGNOSIS — K219 Gastro-esophageal reflux disease without esophagitis: Secondary | ICD-10-CM | POA: Diagnosis not present

## 2015-12-12 DIAGNOSIS — M25551 Pain in right hip: Secondary | ICD-10-CM | POA: Diagnosis not present

## 2015-12-12 DIAGNOSIS — E7800 Pure hypercholesterolemia, unspecified: Secondary | ICD-10-CM | POA: Diagnosis not present

## 2015-12-13 DIAGNOSIS — Z888 Allergy status to other drugs, medicaments and biological substances status: Secondary | ICD-10-CM | POA: Diagnosis not present

## 2015-12-13 DIAGNOSIS — K451 Other specified abdominal hernia with gangrene: Secondary | ICD-10-CM | POA: Diagnosis present

## 2015-12-13 DIAGNOSIS — K5669 Other intestinal obstruction: Secondary | ICD-10-CM | POA: Diagnosis not present

## 2015-12-13 DIAGNOSIS — K5730 Diverticulosis of large intestine without perforation or abscess without bleeding: Secondary | ICD-10-CM | POA: Diagnosis not present

## 2015-12-13 DIAGNOSIS — Z7982 Long term (current) use of aspirin: Secondary | ICD-10-CM | POA: Diagnosis not present

## 2015-12-13 DIAGNOSIS — E785 Hyperlipidemia, unspecified: Secondary | ICD-10-CM | POA: Diagnosis not present

## 2015-12-13 DIAGNOSIS — M6281 Muscle weakness (generalized): Secondary | ICD-10-CM | POA: Diagnosis not present

## 2015-12-13 DIAGNOSIS — K5900 Constipation, unspecified: Secondary | ICD-10-CM | POA: Diagnosis not present

## 2015-12-13 DIAGNOSIS — E7800 Pure hypercholesterolemia, unspecified: Secondary | ICD-10-CM | POA: Diagnosis present

## 2015-12-13 DIAGNOSIS — I10 Essential (primary) hypertension: Secondary | ICD-10-CM | POA: Diagnosis present

## 2015-12-13 DIAGNOSIS — K219 Gastro-esophageal reflux disease without esophagitis: Secondary | ICD-10-CM | POA: Diagnosis present

## 2015-12-13 DIAGNOSIS — K450 Other specified abdominal hernia with obstruction, without gangrene: Secondary | ICD-10-CM | POA: Diagnosis not present

## 2015-12-13 DIAGNOSIS — I4891 Unspecified atrial fibrillation: Secondary | ICD-10-CM | POA: Diagnosis not present

## 2015-12-13 DIAGNOSIS — J849 Interstitial pulmonary disease, unspecified: Secondary | ICD-10-CM | POA: Diagnosis not present

## 2015-12-13 DIAGNOSIS — M1990 Unspecified osteoarthritis, unspecified site: Secondary | ICD-10-CM | POA: Diagnosis present

## 2015-12-13 DIAGNOSIS — E876 Hypokalemia: Secondary | ICD-10-CM | POA: Diagnosis present

## 2015-12-13 DIAGNOSIS — Z881 Allergy status to other antibiotic agents status: Secondary | ICD-10-CM | POA: Diagnosis not present

## 2015-12-13 DIAGNOSIS — R410 Disorientation, unspecified: Secondary | ICD-10-CM | POA: Diagnosis not present

## 2015-12-13 DIAGNOSIS — R110 Nausea: Secondary | ICD-10-CM | POA: Diagnosis not present

## 2015-12-13 DIAGNOSIS — I4892 Unspecified atrial flutter: Secondary | ICD-10-CM | POA: Diagnosis present

## 2015-12-13 DIAGNOSIS — Z79899 Other long term (current) drug therapy: Secondary | ICD-10-CM | POA: Diagnosis not present

## 2015-12-13 DIAGNOSIS — R262 Difficulty in walking, not elsewhere classified: Secondary | ICD-10-CM | POA: Diagnosis not present

## 2015-12-13 DIAGNOSIS — Z48815 Encounter for surgical aftercare following surgery on the digestive system: Secondary | ICD-10-CM | POA: Diagnosis not present

## 2015-12-13 DIAGNOSIS — K5660 Unspecified intestinal obstruction: Secondary | ICD-10-CM | POA: Diagnosis not present

## 2015-12-13 DIAGNOSIS — F419 Anxiety disorder, unspecified: Secondary | ICD-10-CM | POA: Diagnosis not present

## 2015-12-23 DIAGNOSIS — M1990 Unspecified osteoarthritis, unspecified site: Secondary | ICD-10-CM | POA: Diagnosis not present

## 2015-12-23 DIAGNOSIS — Z881 Allergy status to other antibiotic agents status: Secondary | ICD-10-CM | POA: Diagnosis not present

## 2015-12-23 DIAGNOSIS — K5900 Constipation, unspecified: Secondary | ICD-10-CM | POA: Diagnosis not present

## 2015-12-23 DIAGNOSIS — J849 Interstitial pulmonary disease, unspecified: Secondary | ICD-10-CM | POA: Diagnosis not present

## 2015-12-23 DIAGNOSIS — E785 Hyperlipidemia, unspecified: Secondary | ICD-10-CM | POA: Diagnosis not present

## 2015-12-23 DIAGNOSIS — K219 Gastro-esophageal reflux disease without esophagitis: Secondary | ICD-10-CM | POA: Diagnosis not present

## 2015-12-23 DIAGNOSIS — I10 Essential (primary) hypertension: Secondary | ICD-10-CM | POA: Diagnosis not present

## 2015-12-23 DIAGNOSIS — R109 Unspecified abdominal pain: Secondary | ICD-10-CM | POA: Diagnosis not present

## 2015-12-23 DIAGNOSIS — F419 Anxiety disorder, unspecified: Secondary | ICD-10-CM | POA: Diagnosis not present

## 2015-12-23 DIAGNOSIS — Z48815 Encounter for surgical aftercare following surgery on the digestive system: Secondary | ICD-10-CM | POA: Diagnosis not present

## 2015-12-23 DIAGNOSIS — Z888 Allergy status to other drugs, medicaments and biological substances status: Secondary | ICD-10-CM | POA: Diagnosis not present

## 2015-12-23 DIAGNOSIS — I4892 Unspecified atrial flutter: Secondary | ICD-10-CM | POA: Diagnosis not present

## 2015-12-23 DIAGNOSIS — K451 Other specified abdominal hernia with gangrene: Secondary | ICD-10-CM | POA: Diagnosis not present

## 2015-12-23 DIAGNOSIS — M6281 Muscle weakness (generalized): Secondary | ICD-10-CM | POA: Diagnosis not present

## 2015-12-23 DIAGNOSIS — R262 Difficulty in walking, not elsewhere classified: Secondary | ICD-10-CM | POA: Diagnosis not present

## 2015-12-23 DIAGNOSIS — M25559 Pain in unspecified hip: Secondary | ICD-10-CM | POA: Diagnosis not present

## 2015-12-23 DIAGNOSIS — I4891 Unspecified atrial fibrillation: Secondary | ICD-10-CM | POA: Diagnosis not present

## 2015-12-25 DIAGNOSIS — K451 Other specified abdominal hernia with gangrene: Secondary | ICD-10-CM | POA: Diagnosis not present

## 2015-12-25 DIAGNOSIS — M25559 Pain in unspecified hip: Secondary | ICD-10-CM | POA: Diagnosis not present

## 2015-12-25 DIAGNOSIS — R109 Unspecified abdominal pain: Secondary | ICD-10-CM | POA: Diagnosis not present

## 2016-01-22 ENCOUNTER — Telehealth: Payer: Self-pay | Admitting: *Deleted

## 2016-01-22 ENCOUNTER — Encounter: Payer: Self-pay | Admitting: *Deleted

## 2016-01-22 NOTE — Telephone Encounter (Signed)
Patient walked into office after [facility] very upset and stated that," I'm supposed to be on diltiazem and they haven't been giving it to me at the rehab center." Patient also said that " I feel like they messed up my heart medications and my cardiologist told me to stay on diltiazem."  Nurse advised patient that she needed to take her medications the way they are listed at her d/c for right now until he records can be reviewed. Patient also informed that [facility] to see why diltiazem was stopped and when it was stopped. Patient said that [facility] and was released to a rehab center afterwards. Patient said she had been at rehab for the past 24 days. Nurse [facility] and apparently diltiazem was placed on hold when [facility] and was never restarted. Patient advised that an appointment will be scheduled for her to come into office to discuss cardiac medication management. Medication list was reconciled with list that patient brought to office today.

## 2016-01-23 DIAGNOSIS — I10 Essential (primary) hypertension: Secondary | ICD-10-CM | POA: Diagnosis not present

## 2016-01-23 DIAGNOSIS — M1990 Unspecified osteoarthritis, unspecified site: Secondary | ICD-10-CM | POA: Diagnosis not present

## 2016-01-23 DIAGNOSIS — I4891 Unspecified atrial fibrillation: Secondary | ICD-10-CM | POA: Diagnosis not present

## 2016-01-23 DIAGNOSIS — I509 Heart failure, unspecified: Secondary | ICD-10-CM | POA: Diagnosis not present

## 2016-01-23 DIAGNOSIS — M6281 Muscle weakness (generalized): Secondary | ICD-10-CM | POA: Diagnosis not present

## 2016-01-28 DIAGNOSIS — I4891 Unspecified atrial fibrillation: Secondary | ICD-10-CM | POA: Diagnosis not present

## 2016-01-28 DIAGNOSIS — M6281 Muscle weakness (generalized): Secondary | ICD-10-CM | POA: Diagnosis not present

## 2016-01-28 DIAGNOSIS — I10 Essential (primary) hypertension: Secondary | ICD-10-CM | POA: Diagnosis not present

## 2016-01-28 DIAGNOSIS — M1990 Unspecified osteoarthritis, unspecified site: Secondary | ICD-10-CM | POA: Diagnosis not present

## 2016-01-28 DIAGNOSIS — I509 Heart failure, unspecified: Secondary | ICD-10-CM | POA: Diagnosis not present

## 2016-01-29 DIAGNOSIS — I4891 Unspecified atrial fibrillation: Secondary | ICD-10-CM | POA: Diagnosis not present

## 2016-01-29 DIAGNOSIS — I10 Essential (primary) hypertension: Secondary | ICD-10-CM | POA: Diagnosis not present

## 2016-01-29 DIAGNOSIS — M1990 Unspecified osteoarthritis, unspecified site: Secondary | ICD-10-CM | POA: Diagnosis not present

## 2016-01-29 DIAGNOSIS — I509 Heart failure, unspecified: Secondary | ICD-10-CM | POA: Diagnosis not present

## 2016-01-29 DIAGNOSIS — M6281 Muscle weakness (generalized): Secondary | ICD-10-CM | POA: Diagnosis not present

## 2016-01-30 DIAGNOSIS — Z789 Other specified health status: Secondary | ICD-10-CM | POA: Diagnosis not present

## 2016-01-30 DIAGNOSIS — I10 Essential (primary) hypertension: Secondary | ICD-10-CM | POA: Diagnosis not present

## 2016-01-30 DIAGNOSIS — Z299 Encounter for prophylactic measures, unspecified: Secondary | ICD-10-CM | POA: Diagnosis not present

## 2016-01-30 DIAGNOSIS — I4892 Unspecified atrial flutter: Secondary | ICD-10-CM | POA: Diagnosis not present

## 2016-01-31 DIAGNOSIS — I4891 Unspecified atrial fibrillation: Secondary | ICD-10-CM | POA: Diagnosis not present

## 2016-01-31 DIAGNOSIS — I10 Essential (primary) hypertension: Secondary | ICD-10-CM | POA: Diagnosis not present

## 2016-01-31 DIAGNOSIS — M6281 Muscle weakness (generalized): Secondary | ICD-10-CM | POA: Diagnosis not present

## 2016-01-31 DIAGNOSIS — M1990 Unspecified osteoarthritis, unspecified site: Secondary | ICD-10-CM | POA: Diagnosis not present

## 2016-01-31 DIAGNOSIS — I509 Heart failure, unspecified: Secondary | ICD-10-CM | POA: Diagnosis not present

## 2016-02-03 ENCOUNTER — Encounter: Payer: Self-pay | Admitting: Cardiology

## 2016-02-03 ENCOUNTER — Ambulatory Visit (INDEPENDENT_AMBULATORY_CARE_PROVIDER_SITE_OTHER): Payer: Medicare Other | Admitting: Cardiology

## 2016-02-03 VITALS — BP 110/70 | HR 73 | Ht 63.0 in | Wt 103.0 lb

## 2016-02-03 DIAGNOSIS — I480 Paroxysmal atrial fibrillation: Secondary | ICD-10-CM

## 2016-02-03 DIAGNOSIS — Z8679 Personal history of other diseases of the circulatory system: Secondary | ICD-10-CM | POA: Diagnosis not present

## 2016-02-03 NOTE — Patient Instructions (Signed)
Your physician recommends that you continue on your current medications as directed. Please refer to the Current Medication list given to you today.  Your physician recommends that you schedule a follow-up appointment in: 3 months  

## 2016-02-03 NOTE — Progress Notes (Signed)
Cardiology Office Note  Date: 02/03/2016   ID: Erica [name], DOB [birth year], MRN [id number]  PCP: Kirstie Peri, MD  Primary Cardiologist: Nona Dell, MD   Chief Complaint  Patient presents with  . Atrial Fibrillation    History of Present Illness: Erica [name] is an 82 y.o. female last seen in March. Interval records indicate hospitalization at [facility] in April with nausea and anorexia, was ultimately found to have an obturator foramen hernia that required surgical intervention. She had a follow-up stay at the [facility] nursing center for rehabilitation, and at some point along the way was taken off of her Cardizem CD.  She comes in today with her daughter for a follow-up visit. She has been concerned about being off Cardizem CD, however has not had any significant symptoms of palpitations. Heart rate is regular today. She has maintained amiodarone.  Most recent cardiac regimen has included HCTZ, amiodarone, lisinopril, and aspirin.  I reviewed the available medical records. Today we discussed her medications and have decided to continue with the present regimen, and not add back Cardizem CD unless she starts to experience more palpitations or obvious breakthrough rapid atrial fibrillation.  Past Medical History  Diagnosis Date  . Atrial flutter (HCC)     Status post RFA  . Atrial fibrillation (HCC)     Declines Coumadin  . Interstitial lung disease (HCC)     Mild, DLCO of 68%    Past Surgical History  Procedure Laterality Date  . Laparoscopic inguinal hernia repair Right 01/15/2014    Current Outpatient Prescriptions  Medication Sig Dispense Refill  . alendronate (FOSAMAX) 70 MG tablet Take 70 mg by mouth once a week. Take with a full glass of water on an empty stomach.    . ALPRAZolam (XANAX) 0.25 MG tablet Take 0.25 mg by mouth 2 (two) times daily as needed.    Marland Kitchen amiodarone (PACERONE) 200 MG tablet Take 100 mg by mouth daily.     Marland Kitchen aspirin 325 MG tablet  Take 325 mg by mouth daily.      . cholecalciferol (VITAMIN D) 1000 units tablet Take 1,000 Units by mouth daily.    . hydrochlorothiazide (MICROZIDE) 12.5 MG capsule Take 12.5 mg by mouth daily.      Marland Kitchen lisinopril (PRINIVIL,ZESTRIL) 10 MG tablet Take 5 mg by mouth daily.     . Multiple Vitamin (MULTIVITAMIN) tablet Take 1 tablet by mouth daily.    Marland Kitchen omeprazole (PRILOSEC) 20 MG capsule Take 20 mg by mouth 2 (two) times daily.     Bertram Gala Glycol-Propyl Glycol (SYSTANE OP) Apply 2 drops to eye daily.    . polyethylene glycol (MIRALAX / GLYCOLAX) packet Take 17 g by mouth daily.     No current facility-administered medications for this visit.   Allergies:  Ciprofloxacin   Social History: The patient  reports that she has never smoked. She has never used smokeless tobacco. She reports that she does not drink alcohol or use illicit drugs.   ROS:  Please see the history of present illness. Otherwise, complete review of systems is positive for improving weakness, uses a cane.  All other systems are reviewed and negative.   Physical Exam: VS:  BP 110/70 mmHg  Pulse 73  Ht  (1.6 m)  Wt 103 lb (46.72 kg)  BMI 18.25 kg/m2  SpO2 99%, BMI Body mass index is 18.25 kg/(m^2).  Wt Readings from Last 3 Encounters:  02/03/16 103 lb (46.72 kg)  11/04/15 108 lb  12.8 oz (49.351 kg)  05/07/15 116 lb 6.4 oz (52.799 kg)    Thin elderly woman, appears comfortable.  HEENT: Conjunctiva and lids normal, oropharynx clear.  Neck: Supple, no elevated JVP or carotid bruits, no thyromegaly.  Lungs: Clear to auscultation, nonlabored breathing at rest.  Cardiac: Regular rate and rhythm, no S3, soft systolic murmur.  Abdomen: Soft, nontender, bowel sounds present.  Extremities: No pitting edema, distal pulses 2+.   ECG: I personally reviewed [facility] which showed sinus rhythm with IVCD of left bundle branch block type.  Recent Labwork:  April 2017: BUN 2,  creatinine 0.4, potassium 4.6, AST 28, ALT 17, hemoglobin 11.5, platelets 249.  Assessment and Plan:  1. Paroxysmal atrial fibrillation. For now we will continue amiodarone but leave her off Cardizem CD unless she has recurring symptoms of palpitations or obvious breakthrough atrial fibrillation. She continues on aspirin having declined anticoagulation.  2. History of atrial flutter radiofrequency ablation in 2010.  Current medicines were reviewed with the patient today.  Disposition: FU with me in 3 months.   Signed, Jonelle SidleSamuel G. Matteo Banke, MD, [facility] 02/03/2016 10:39 AM    [facility] [address], [place], [place] [address] Phone: [phone]; Fax: [phone]

## 2016-02-04 DIAGNOSIS — I739 Peripheral vascular disease, unspecified: Secondary | ICD-10-CM | POA: Diagnosis not present

## 2016-02-04 DIAGNOSIS — M1990 Unspecified osteoarthritis, unspecified site: Secondary | ICD-10-CM | POA: Diagnosis not present

## 2016-02-04 DIAGNOSIS — I509 Heart failure, unspecified: Secondary | ICD-10-CM | POA: Diagnosis not present

## 2016-02-04 DIAGNOSIS — M6281 Muscle weakness (generalized): Secondary | ICD-10-CM | POA: Diagnosis not present

## 2016-02-04 DIAGNOSIS — I4891 Unspecified atrial fibrillation: Secondary | ICD-10-CM | POA: Diagnosis not present

## 2016-02-04 DIAGNOSIS — I10 Essential (primary) hypertension: Secondary | ICD-10-CM | POA: Diagnosis not present

## 2016-02-06 DIAGNOSIS — H353122 Nonexudative age-related macular degeneration, left eye, intermediate dry stage: Secondary | ICD-10-CM | POA: Diagnosis not present

## 2016-02-06 DIAGNOSIS — H35371 Puckering of macula, right eye: Secondary | ICD-10-CM | POA: Diagnosis not present

## 2016-02-06 DIAGNOSIS — H353211 Exudative age-related macular degeneration, right eye, with active choroidal neovascularization: Secondary | ICD-10-CM | POA: Diagnosis not present

## 2016-02-07 DIAGNOSIS — M1990 Unspecified osteoarthritis, unspecified site: Secondary | ICD-10-CM | POA: Diagnosis not present

## 2016-02-07 DIAGNOSIS — I509 Heart failure, unspecified: Secondary | ICD-10-CM | POA: Diagnosis not present

## 2016-02-07 DIAGNOSIS — I4891 Unspecified atrial fibrillation: Secondary | ICD-10-CM | POA: Diagnosis not present

## 2016-02-07 DIAGNOSIS — I10 Essential (primary) hypertension: Secondary | ICD-10-CM | POA: Diagnosis not present

## 2016-02-07 DIAGNOSIS — M6281 Muscle weakness (generalized): Secondary | ICD-10-CM | POA: Diagnosis not present

## 2016-02-24 DIAGNOSIS — I4891 Unspecified atrial fibrillation: Secondary | ICD-10-CM | POA: Diagnosis not present

## 2016-02-24 DIAGNOSIS — E7800 Pure hypercholesterolemia, unspecified: Secondary | ICD-10-CM | POA: Diagnosis not present

## 2016-02-24 DIAGNOSIS — I10 Essential (primary) hypertension: Secondary | ICD-10-CM | POA: Diagnosis not present

## 2016-03-06 DIAGNOSIS — E7800 Pure hypercholesterolemia, unspecified: Secondary | ICD-10-CM | POA: Diagnosis not present

## 2016-03-06 DIAGNOSIS — I10 Essential (primary) hypertension: Secondary | ICD-10-CM | POA: Diagnosis not present

## 2016-03-06 DIAGNOSIS — I4891 Unspecified atrial fibrillation: Secondary | ICD-10-CM | POA: Diagnosis not present

## 2016-03-12 DIAGNOSIS — I10 Essential (primary) hypertension: Secondary | ICD-10-CM | POA: Diagnosis not present

## 2016-03-12 DIAGNOSIS — Z299 Encounter for prophylactic measures, unspecified: Secondary | ICD-10-CM | POA: Diagnosis not present

## 2016-03-12 DIAGNOSIS — I4892 Unspecified atrial flutter: Secondary | ICD-10-CM | POA: Diagnosis not present

## 2016-03-26 DIAGNOSIS — M7062 Trochanteric bursitis, left hip: Secondary | ICD-10-CM | POA: Diagnosis not present

## 2016-03-27 DIAGNOSIS — R1110 Vomiting, unspecified: Secondary | ICD-10-CM | POA: Diagnosis not present

## 2016-03-27 DIAGNOSIS — Z881 Allergy status to other antibiotic agents status: Secondary | ICD-10-CM | POA: Diagnosis not present

## 2016-03-27 DIAGNOSIS — K5669 Other intestinal obstruction: Secondary | ICD-10-CM | POA: Diagnosis not present

## 2016-03-27 DIAGNOSIS — J40 Bronchitis, not specified as acute or chronic: Secondary | ICD-10-CM | POA: Diagnosis not present

## 2016-03-27 DIAGNOSIS — Z79899 Other long term (current) drug therapy: Secondary | ICD-10-CM | POA: Diagnosis not present

## 2016-03-27 DIAGNOSIS — K5730 Diverticulosis of large intestine without perforation or abscess without bleeding: Secondary | ICD-10-CM | POA: Diagnosis not present

## 2016-03-27 DIAGNOSIS — K5660 Unspecified intestinal obstruction: Secondary | ICD-10-CM | POA: Diagnosis not present

## 2016-03-27 DIAGNOSIS — K450 Other specified abdominal hernia with obstruction, without gangrene: Secondary | ICD-10-CM | POA: Diagnosis not present

## 2016-03-27 DIAGNOSIS — K219 Gastro-esophageal reflux disease without esophagitis: Secondary | ICD-10-CM | POA: Diagnosis not present

## 2016-03-27 DIAGNOSIS — M7062 Trochanteric bursitis, left hip: Secondary | ICD-10-CM | POA: Diagnosis not present

## 2016-03-27 DIAGNOSIS — M25552 Pain in left hip: Secondary | ICD-10-CM | POA: Diagnosis not present

## 2016-03-27 DIAGNOSIS — Z7982 Long term (current) use of aspirin: Secondary | ICD-10-CM | POA: Diagnosis not present

## 2016-03-27 DIAGNOSIS — I10 Essential (primary) hypertension: Secondary | ICD-10-CM | POA: Diagnosis not present

## 2016-03-27 DIAGNOSIS — R1084 Generalized abdominal pain: Secondary | ICD-10-CM | POA: Diagnosis not present

## 2016-03-27 DIAGNOSIS — R110 Nausea: Secondary | ICD-10-CM | POA: Diagnosis not present

## 2016-03-27 DIAGNOSIS — M1990 Unspecified osteoarthritis, unspecified site: Secondary | ICD-10-CM | POA: Diagnosis not present

## 2016-03-28 DIAGNOSIS — Z883 Allergy status to other anti-infective agents status: Secondary | ICD-10-CM | POA: Diagnosis not present

## 2016-03-28 DIAGNOSIS — R1110 Vomiting, unspecified: Secondary | ICD-10-CM | POA: Diagnosis not present

## 2016-03-28 DIAGNOSIS — R262 Difficulty in walking, not elsewhere classified: Secondary | ICD-10-CM | POA: Diagnosis not present

## 2016-03-28 DIAGNOSIS — M6281 Muscle weakness (generalized): Secondary | ICD-10-CM | POA: Diagnosis not present

## 2016-03-28 DIAGNOSIS — K219 Gastro-esophageal reflux disease without esophagitis: Secondary | ICD-10-CM | POA: Diagnosis present

## 2016-03-28 DIAGNOSIS — R1084 Generalized abdominal pain: Secondary | ICD-10-CM | POA: Diagnosis not present

## 2016-03-28 DIAGNOSIS — M1990 Unspecified osteoarthritis, unspecified site: Secondary | ICD-10-CM | POA: Diagnosis present

## 2016-03-28 DIAGNOSIS — Z7982 Long term (current) use of aspirin: Secondary | ICD-10-CM | POA: Diagnosis not present

## 2016-03-28 DIAGNOSIS — K5660 Unspecified intestinal obstruction: Secondary | ICD-10-CM | POA: Diagnosis not present

## 2016-03-28 DIAGNOSIS — K5730 Diverticulosis of large intestine without perforation or abscess without bleeding: Secondary | ICD-10-CM | POA: Diagnosis not present

## 2016-03-28 DIAGNOSIS — K5669 Other intestinal obstruction: Secondary | ICD-10-CM | POA: Diagnosis not present

## 2016-03-28 DIAGNOSIS — E785 Hyperlipidemia, unspecified: Secondary | ICD-10-CM | POA: Diagnosis not present

## 2016-03-28 DIAGNOSIS — F419 Anxiety disorder, unspecified: Secondary | ICD-10-CM | POA: Diagnosis present

## 2016-03-28 DIAGNOSIS — R05 Cough: Secondary | ICD-10-CM | POA: Diagnosis not present

## 2016-03-28 DIAGNOSIS — K450 Other specified abdominal hernia with obstruction, without gangrene: Secondary | ICD-10-CM | POA: Diagnosis present

## 2016-03-28 DIAGNOSIS — R2689 Other abnormalities of gait and mobility: Secondary | ICD-10-CM | POA: Diagnosis not present

## 2016-03-28 DIAGNOSIS — Z881 Allergy status to other antibiotic agents status: Secondary | ICD-10-CM | POA: Diagnosis not present

## 2016-03-28 DIAGNOSIS — E7800 Pure hypercholesterolemia, unspecified: Secondary | ICD-10-CM | POA: Diagnosis present

## 2016-03-28 DIAGNOSIS — Z4889 Encounter for other specified surgical aftercare: Secondary | ICD-10-CM | POA: Diagnosis not present

## 2016-03-28 DIAGNOSIS — I4891 Unspecified atrial fibrillation: Secondary | ICD-10-CM | POA: Diagnosis not present

## 2016-03-28 DIAGNOSIS — I10 Essential (primary) hypertension: Secondary | ICD-10-CM | POA: Diagnosis present

## 2016-03-28 DIAGNOSIS — Z8249 Family history of ischemic heart disease and other diseases of the circulatory system: Secondary | ICD-10-CM | POA: Diagnosis not present

## 2016-03-28 DIAGNOSIS — J40 Bronchitis, not specified as acute or chronic: Secondary | ICD-10-CM | POA: Diagnosis not present

## 2016-03-28 DIAGNOSIS — Z79899 Other long term (current) drug therapy: Secondary | ICD-10-CM | POA: Diagnosis not present

## 2016-03-28 DIAGNOSIS — J849 Interstitial pulmonary disease, unspecified: Secondary | ICD-10-CM | POA: Diagnosis not present

## 2016-03-28 DIAGNOSIS — K5790 Diverticulosis of intestine, part unspecified, without perforation or abscess without bleeding: Secondary | ICD-10-CM | POA: Diagnosis not present

## 2016-03-28 DIAGNOSIS — Z888 Allergy status to other drugs, medicaments and biological substances status: Secondary | ICD-10-CM | POA: Diagnosis not present

## 2016-03-28 DIAGNOSIS — Z79891 Long term (current) use of opiate analgesic: Secondary | ICD-10-CM | POA: Diagnosis not present

## 2016-04-07 DIAGNOSIS — I10 Essential (primary) hypertension: Secondary | ICD-10-CM | POA: Diagnosis not present

## 2016-04-07 DIAGNOSIS — K450 Other specified abdominal hernia with obstruction, without gangrene: Secondary | ICD-10-CM | POA: Diagnosis not present

## 2016-04-07 DIAGNOSIS — Z881 Allergy status to other antibiotic agents status: Secondary | ICD-10-CM | POA: Diagnosis not present

## 2016-04-07 DIAGNOSIS — K5660 Unspecified intestinal obstruction: Secondary | ICD-10-CM | POA: Diagnosis not present

## 2016-04-07 DIAGNOSIS — M6281 Muscle weakness (generalized): Secondary | ICD-10-CM | POA: Diagnosis not present

## 2016-04-07 DIAGNOSIS — K5790 Diverticulosis of intestine, part unspecified, without perforation or abscess without bleeding: Secondary | ICD-10-CM | POA: Diagnosis not present

## 2016-04-07 DIAGNOSIS — R262 Difficulty in walking, not elsewhere classified: Secondary | ICD-10-CM | POA: Diagnosis not present

## 2016-04-07 DIAGNOSIS — E785 Hyperlipidemia, unspecified: Secondary | ICD-10-CM | POA: Diagnosis not present

## 2016-04-07 DIAGNOSIS — Z4889 Encounter for other specified surgical aftercare: Secondary | ICD-10-CM | POA: Diagnosis not present

## 2016-04-07 DIAGNOSIS — Z883 Allergy status to other anti-infective agents status: Secondary | ICD-10-CM | POA: Diagnosis not present

## 2016-04-07 DIAGNOSIS — K219 Gastro-esophageal reflux disease without esophagitis: Secondary | ICD-10-CM | POA: Diagnosis not present

## 2016-04-07 DIAGNOSIS — I4891 Unspecified atrial fibrillation: Secondary | ICD-10-CM | POA: Diagnosis not present

## 2016-04-07 DIAGNOSIS — F419 Anxiety disorder, unspecified: Secondary | ICD-10-CM | POA: Diagnosis not present

## 2016-04-07 DIAGNOSIS — R2689 Other abnormalities of gait and mobility: Secondary | ICD-10-CM | POA: Diagnosis not present

## 2016-04-07 DIAGNOSIS — J849 Interstitial pulmonary disease, unspecified: Secondary | ICD-10-CM | POA: Diagnosis not present

## 2016-04-07 DIAGNOSIS — R600 Localized edema: Secondary | ICD-10-CM | POA: Diagnosis not present

## 2016-04-07 DIAGNOSIS — M1990 Unspecified osteoarthritis, unspecified site: Secondary | ICD-10-CM | POA: Diagnosis not present

## 2016-04-09 DIAGNOSIS — R600 Localized edema: Secondary | ICD-10-CM | POA: Diagnosis not present

## 2016-04-09 DIAGNOSIS — K450 Other specified abdominal hernia with obstruction, without gangrene: Secondary | ICD-10-CM | POA: Diagnosis not present

## 2016-05-03 NOTE — Progress Notes (Signed)
Cardiology Office Note  Date: 05/05/2016   ID: Erica [name], DOB 12-10-1927, MRN [id number]  PCP: Kirstie PeriSHAH,ASHISH, MD  Primary Cardiologist: Nona DellSamuel Arjuna Doeden, MD   Chief Complaint  Patient presents with  . Atrial Fibrillation    History of Present Illness: Erica [name] is an 82 y.o. female last seen in June. She presents for a routine follow-up visit today. In the interim she underwent hernia surgery and just completed a rehabilitation stay at the [facility]. She is back at home. Using a cane or walker to help her get around. She does not report any trouble with palpitations.  I reviewed her medications. She continues on aspirin and low-dose amiodarone with symptomatically good control of her PAF. She has declined anticoagulation as mentioned previously.  Past Medical History:  Diagnosis Date  . Atrial fibrillation (HCC)    Declines Coumadin  . Atrial flutter (HCC)    Status post RFA  . Interstitial lung disease (HCC)    Mild, DLCO of 68%    Current Outpatient Prescriptions  Medication Sig Dispense Refill  . alendronate (FOSAMAX) 70 MG tablet Take 70 mg by mouth once a week. Take with a full glass of water on an empty stomach.    . ALPRAZolam (XANAX) 0.25 MG tablet Take 0.25 mg by mouth 2 (two) times daily as needed.    Marland Kitchen. amiodarone (PACERONE) 200 MG tablet Take 100 mg by mouth daily.     Marland Kitchen. aspirin 325 MG tablet Take 325 mg by mouth daily.      . cholecalciferol (VITAMIN D) 1000 units tablet Take 1,000 Units by mouth daily.    . hydrochlorothiazide (MICROZIDE) 12.5 MG capsule Take 12.5 mg by mouth daily.      Marland Kitchen. lisinopril (PRINIVIL,ZESTRIL) 10 MG tablet Take 5 mg by mouth daily.     . Multiple Vitamin (MULTIVITAMIN) tablet Take 1 tablet by mouth daily.    Marland Kitchen. omeprazole (PRILOSEC) 20 MG capsule Take 20 mg by mouth 2 (two) times daily.     Bertram Gala. Polyethyl Glycol-Propyl Glycol (SYSTANE OP) Apply 2 drops to eye daily.    . polyethylene glycol (MIRALAX / GLYCOLAX)  packet Take 17 g by mouth daily.     No current facility-administered medications for this visit.    Allergies:  Ciprofloxacin   Social History: The patient  reports that she has never smoked. She has never used smokeless tobacco. She reports that she does not drink alcohol or use drugs.   ROS:  Please see the history of present illness. Otherwise, complete review of systems is positive for fatigue, weight loss.  All other systems are reviewed and negative.   Physical Exam: VS:  BP 108/70   Pulse 72   Ht 5\' 3"  (1.6 m)   Wt 98 lb (44.5 kg)   SpO2 98%   BMI 17.36 kg/m , BMI Body mass index is 17.36 kg/m.  Wt Readings from Last 3 Encounters:  05/05/16 98 lb (44.5 kg)  02/03/16 103 lb (46.7 kg)  11/04/15 108 lb 12.8 oz (49.4 kg)    Thin elderly woman, appears comfortable.  HEENT: Conjunctiva and lids normal, oropharynx clear.  Neck: Supple, no elevated JVP or carotid bruits, no thyromegaly.  Lungs: Clear to auscultation, nonlabored breathing at rest.  Cardiac: Regular rate and rhythm, no S3, soft systolic murmur.  Abdomen: Soft, nontender, bowel sounds present.  Extremities: No pitting edema, distal pulses 2+.   ECG: I personally reviewed the tracing from 10/10/[facility] at [facility] which  showed sinus rhythm with IVCD of left bundle branch block type.  Recent Labwork:  April 2017: BUN 2, creatinine 0.4, potassium 4.6, AST 28, ALT 17, hemoglobin 11.5, platelets 249.  Assessment and Plan:  1. Paroxysmal atrial fibrillation, symptomatically stable. She continues on low-dose amiodarone and aspirin, having declined anticoagulation consistently. I reviewed her most recent lab work. Will need LFTs and TSH in the next 6 months. Annual eye exams.  2. History of atrial flutter status post radiofrequency ablation in 2010.  Current medicines were reviewed with the patient today.  Disposition: Follow-up with me in 4 months.  Signed, Jonelle Sidle, MD, [facility] 05/05/2016 10:25 AM      [facility] [address] [place], [place], [place] [address] Phone: [phone]; Fax: [phone]

## 2016-05-05 ENCOUNTER — Ambulatory Visit (INDEPENDENT_AMBULATORY_CARE_PROVIDER_SITE_OTHER): Payer: Medicare Other | Admitting: Cardiology

## 2016-05-05 ENCOUNTER — Encounter: Payer: Self-pay | Admitting: Cardiology

## 2016-05-05 VITALS — BP 108/70 | HR 72 | Ht 63.0 in | Wt 98.0 lb

## 2016-05-05 DIAGNOSIS — I480 Paroxysmal atrial fibrillation: Secondary | ICD-10-CM | POA: Diagnosis not present

## 2016-05-05 NOTE — Patient Instructions (Signed)

## 2016-05-06 DIAGNOSIS — K219 Gastro-esophageal reflux disease without esophagitis: Secondary | ICD-10-CM | POA: Diagnosis not present

## 2016-05-06 DIAGNOSIS — I4891 Unspecified atrial fibrillation: Secondary | ICD-10-CM | POA: Diagnosis not present

## 2016-05-06 DIAGNOSIS — I10 Essential (primary) hypertension: Secondary | ICD-10-CM | POA: Diagnosis not present

## 2016-05-06 DIAGNOSIS — M6281 Muscle weakness (generalized): Secondary | ICD-10-CM | POA: Diagnosis not present

## 2016-05-06 DIAGNOSIS — R269 Unspecified abnormalities of gait and mobility: Secondary | ICD-10-CM | POA: Diagnosis not present

## 2016-05-06 DIAGNOSIS — M1990 Unspecified osteoarthritis, unspecified site: Secondary | ICD-10-CM | POA: Diagnosis not present

## 2016-05-08 DIAGNOSIS — Z299 Encounter for prophylactic measures, unspecified: Secondary | ICD-10-CM | POA: Diagnosis not present

## 2016-05-08 DIAGNOSIS — K219 Gastro-esophageal reflux disease without esophagitis: Secondary | ICD-10-CM | POA: Diagnosis not present

## 2016-05-08 DIAGNOSIS — E7800 Pure hypercholesterolemia, unspecified: Secondary | ICD-10-CM | POA: Diagnosis not present

## 2016-05-08 DIAGNOSIS — I10 Essential (primary) hypertension: Secondary | ICD-10-CM | POA: Diagnosis not present

## 2016-05-08 DIAGNOSIS — M1990 Unspecified osteoarthritis, unspecified site: Secondary | ICD-10-CM | POA: Diagnosis not present

## 2016-05-08 DIAGNOSIS — R269 Unspecified abnormalities of gait and mobility: Secondary | ICD-10-CM | POA: Diagnosis not present

## 2016-05-08 DIAGNOSIS — M6281 Muscle weakness (generalized): Secondary | ICD-10-CM | POA: Diagnosis not present

## 2016-05-08 DIAGNOSIS — I4891 Unspecified atrial fibrillation: Secondary | ICD-10-CM | POA: Diagnosis not present

## 2016-05-11 DIAGNOSIS — I10 Essential (primary) hypertension: Secondary | ICD-10-CM | POA: Diagnosis not present

## 2016-05-11 DIAGNOSIS — I4891 Unspecified atrial fibrillation: Secondary | ICD-10-CM | POA: Diagnosis not present

## 2016-05-11 DIAGNOSIS — K219 Gastro-esophageal reflux disease without esophagitis: Secondary | ICD-10-CM | POA: Diagnosis not present

## 2016-05-11 DIAGNOSIS — R269 Unspecified abnormalities of gait and mobility: Secondary | ICD-10-CM | POA: Diagnosis not present

## 2016-05-11 DIAGNOSIS — M1990 Unspecified osteoarthritis, unspecified site: Secondary | ICD-10-CM | POA: Diagnosis not present

## 2016-05-11 DIAGNOSIS — M6281 Muscle weakness (generalized): Secondary | ICD-10-CM | POA: Diagnosis not present

## 2016-05-12 DIAGNOSIS — I4891 Unspecified atrial fibrillation: Secondary | ICD-10-CM | POA: Diagnosis not present

## 2016-05-12 DIAGNOSIS — M1990 Unspecified osteoarthritis, unspecified site: Secondary | ICD-10-CM | POA: Diagnosis not present

## 2016-05-12 DIAGNOSIS — I10 Essential (primary) hypertension: Secondary | ICD-10-CM | POA: Diagnosis not present

## 2016-05-12 DIAGNOSIS — R269 Unspecified abnormalities of gait and mobility: Secondary | ICD-10-CM | POA: Diagnosis not present

## 2016-05-12 DIAGNOSIS — M6281 Muscle weakness (generalized): Secondary | ICD-10-CM | POA: Diagnosis not present

## 2016-05-12 DIAGNOSIS — K219 Gastro-esophageal reflux disease without esophagitis: Secondary | ICD-10-CM | POA: Diagnosis not present

## 2016-05-14 DIAGNOSIS — M1990 Unspecified osteoarthritis, unspecified site: Secondary | ICD-10-CM | POA: Diagnosis not present

## 2016-05-14 DIAGNOSIS — I4891 Unspecified atrial fibrillation: Secondary | ICD-10-CM | POA: Diagnosis not present

## 2016-05-14 DIAGNOSIS — M6281 Muscle weakness (generalized): Secondary | ICD-10-CM | POA: Diagnosis not present

## 2016-05-14 DIAGNOSIS — R269 Unspecified abnormalities of gait and mobility: Secondary | ICD-10-CM | POA: Diagnosis not present

## 2016-05-14 DIAGNOSIS — I10 Essential (primary) hypertension: Secondary | ICD-10-CM | POA: Diagnosis not present

## 2016-05-14 DIAGNOSIS — K219 Gastro-esophageal reflux disease without esophagitis: Secondary | ICD-10-CM | POA: Diagnosis not present

## 2016-05-15 DIAGNOSIS — M6281 Muscle weakness (generalized): Secondary | ICD-10-CM | POA: Diagnosis not present

## 2016-05-15 DIAGNOSIS — R269 Unspecified abnormalities of gait and mobility: Secondary | ICD-10-CM | POA: Diagnosis not present

## 2016-05-15 DIAGNOSIS — I10 Essential (primary) hypertension: Secondary | ICD-10-CM | POA: Diagnosis not present

## 2016-05-15 DIAGNOSIS — K219 Gastro-esophageal reflux disease without esophagitis: Secondary | ICD-10-CM | POA: Diagnosis not present

## 2016-05-15 DIAGNOSIS — I4891 Unspecified atrial fibrillation: Secondary | ICD-10-CM | POA: Diagnosis not present

## 2016-05-15 DIAGNOSIS — M1990 Unspecified osteoarthritis, unspecified site: Secondary | ICD-10-CM | POA: Diagnosis not present

## 2016-05-19 DIAGNOSIS — K219 Gastro-esophageal reflux disease without esophagitis: Secondary | ICD-10-CM | POA: Diagnosis not present

## 2016-05-19 DIAGNOSIS — R269 Unspecified abnormalities of gait and mobility: Secondary | ICD-10-CM | POA: Diagnosis not present

## 2016-05-19 DIAGNOSIS — I4891 Unspecified atrial fibrillation: Secondary | ICD-10-CM | POA: Diagnosis not present

## 2016-05-19 DIAGNOSIS — I10 Essential (primary) hypertension: Secondary | ICD-10-CM | POA: Diagnosis not present

## 2016-05-19 DIAGNOSIS — M1990 Unspecified osteoarthritis, unspecified site: Secondary | ICD-10-CM | POA: Diagnosis not present

## 2016-05-19 DIAGNOSIS — M6281 Muscle weakness (generalized): Secondary | ICD-10-CM | POA: Diagnosis not present

## 2016-05-21 DIAGNOSIS — I10 Essential (primary) hypertension: Secondary | ICD-10-CM | POA: Diagnosis not present

## 2016-05-21 DIAGNOSIS — I4891 Unspecified atrial fibrillation: Secondary | ICD-10-CM | POA: Diagnosis not present

## 2016-05-21 DIAGNOSIS — R269 Unspecified abnormalities of gait and mobility: Secondary | ICD-10-CM | POA: Diagnosis not present

## 2016-05-21 DIAGNOSIS — K219 Gastro-esophageal reflux disease without esophagitis: Secondary | ICD-10-CM | POA: Diagnosis not present

## 2016-05-21 DIAGNOSIS — M6281 Muscle weakness (generalized): Secondary | ICD-10-CM | POA: Diagnosis not present

## 2016-05-21 DIAGNOSIS — M1990 Unspecified osteoarthritis, unspecified site: Secondary | ICD-10-CM | POA: Diagnosis not present

## 2016-05-22 DIAGNOSIS — M6281 Muscle weakness (generalized): Secondary | ICD-10-CM | POA: Diagnosis not present

## 2016-05-22 DIAGNOSIS — I10 Essential (primary) hypertension: Secondary | ICD-10-CM | POA: Diagnosis not present

## 2016-05-22 DIAGNOSIS — I4891 Unspecified atrial fibrillation: Secondary | ICD-10-CM | POA: Diagnosis not present

## 2016-05-22 DIAGNOSIS — M1990 Unspecified osteoarthritis, unspecified site: Secondary | ICD-10-CM | POA: Diagnosis not present

## 2016-05-22 DIAGNOSIS — R269 Unspecified abnormalities of gait and mobility: Secondary | ICD-10-CM | POA: Diagnosis not present

## 2016-05-22 DIAGNOSIS — K219 Gastro-esophageal reflux disease without esophagitis: Secondary | ICD-10-CM | POA: Diagnosis not present

## 2016-06-02 DIAGNOSIS — I4891 Unspecified atrial fibrillation: Secondary | ICD-10-CM | POA: Diagnosis not present

## 2016-06-02 DIAGNOSIS — E7800 Pure hypercholesterolemia, unspecified: Secondary | ICD-10-CM | POA: Diagnosis not present

## 2016-06-02 DIAGNOSIS — I10 Essential (primary) hypertension: Secondary | ICD-10-CM | POA: Diagnosis not present

## 2016-06-23 DIAGNOSIS — Z23 Encounter for immunization: Secondary | ICD-10-CM | POA: Diagnosis not present

## 2016-06-30 DIAGNOSIS — N390 Urinary tract infection, site not specified: Secondary | ICD-10-CM | POA: Diagnosis not present

## 2016-06-30 DIAGNOSIS — Z713 Dietary counseling and surveillance: Secondary | ICD-10-CM | POA: Diagnosis not present

## 2016-06-30 DIAGNOSIS — Z299 Encounter for prophylactic measures, unspecified: Secondary | ICD-10-CM | POA: Diagnosis not present

## 2016-06-30 DIAGNOSIS — Z681 Body mass index (BMI) 19 or less, adult: Secondary | ICD-10-CM | POA: Diagnosis not present

## 2016-06-30 DIAGNOSIS — R350 Frequency of micturition: Secondary | ICD-10-CM | POA: Diagnosis not present

## 2016-07-14 DIAGNOSIS — I10 Essential (primary) hypertension: Secondary | ICD-10-CM | POA: Diagnosis not present

## 2016-07-14 DIAGNOSIS — I4891 Unspecified atrial fibrillation: Secondary | ICD-10-CM | POA: Diagnosis not present

## 2016-07-14 DIAGNOSIS — E7800 Pure hypercholesterolemia, unspecified: Secondary | ICD-10-CM | POA: Diagnosis not present

## 2016-08-13 DIAGNOSIS — E7800 Pure hypercholesterolemia, unspecified: Secondary | ICD-10-CM | POA: Diagnosis not present

## 2016-08-13 DIAGNOSIS — I10 Essential (primary) hypertension: Secondary | ICD-10-CM | POA: Diagnosis not present

## 2016-08-13 DIAGNOSIS — I4891 Unspecified atrial fibrillation: Secondary | ICD-10-CM | POA: Diagnosis not present

## 2016-08-20 DIAGNOSIS — H353211 Exudative age-related macular degeneration, right eye, with active choroidal neovascularization: Secondary | ICD-10-CM | POA: Diagnosis not present

## 2016-09-02 DIAGNOSIS — I10 Essential (primary) hypertension: Secondary | ICD-10-CM | POA: Diagnosis not present

## 2016-09-02 DIAGNOSIS — I4891 Unspecified atrial fibrillation: Secondary | ICD-10-CM | POA: Diagnosis not present

## 2016-09-02 DIAGNOSIS — E7800 Pure hypercholesterolemia, unspecified: Secondary | ICD-10-CM | POA: Diagnosis not present

## 2016-09-03 ENCOUNTER — Ambulatory Visit: Payer: PRIVATE HEALTH INSURANCE | Admitting: Cardiology

## 2016-09-25 DIAGNOSIS — Z681 Body mass index (BMI) 19 or less, adult: Secondary | ICD-10-CM | POA: Diagnosis not present

## 2016-09-25 DIAGNOSIS — I10 Essential (primary) hypertension: Secondary | ICD-10-CM | POA: Diagnosis not present

## 2016-09-25 DIAGNOSIS — E7800 Pure hypercholesterolemia, unspecified: Secondary | ICD-10-CM | POA: Diagnosis not present

## 2016-09-25 DIAGNOSIS — Z299 Encounter for prophylactic measures, unspecified: Secondary | ICD-10-CM | POA: Diagnosis not present

## 2016-09-25 DIAGNOSIS — R350 Frequency of micturition: Secondary | ICD-10-CM | POA: Diagnosis not present

## 2016-09-25 DIAGNOSIS — I4892 Unspecified atrial flutter: Secondary | ICD-10-CM | POA: Diagnosis not present

## 2016-09-25 DIAGNOSIS — Z789 Other specified health status: Secondary | ICD-10-CM | POA: Diagnosis not present

## 2016-09-25 DIAGNOSIS — R5383 Other fatigue: Secondary | ICD-10-CM | POA: Diagnosis not present

## 2016-09-29 DIAGNOSIS — J849 Interstitial pulmonary disease, unspecified: Secondary | ICD-10-CM | POA: Diagnosis not present

## 2016-09-29 DIAGNOSIS — E7800 Pure hypercholesterolemia, unspecified: Secondary | ICD-10-CM | POA: Diagnosis not present

## 2016-09-29 DIAGNOSIS — Z7189 Other specified counseling: Secondary | ICD-10-CM | POA: Diagnosis not present

## 2016-09-29 DIAGNOSIS — Z1389 Encounter for screening for other disorder: Secondary | ICD-10-CM | POA: Diagnosis not present

## 2016-09-29 DIAGNOSIS — Z681 Body mass index (BMI) 19 or less, adult: Secondary | ICD-10-CM | POA: Diagnosis not present

## 2016-09-29 DIAGNOSIS — I4892 Unspecified atrial flutter: Secondary | ICD-10-CM | POA: Diagnosis not present

## 2016-09-29 DIAGNOSIS — I209 Angina pectoris, unspecified: Secondary | ICD-10-CM | POA: Diagnosis not present

## 2016-09-29 DIAGNOSIS — Z1211 Encounter for screening for malignant neoplasm of colon: Secondary | ICD-10-CM | POA: Diagnosis not present

## 2016-09-29 DIAGNOSIS — Z299 Encounter for prophylactic measures, unspecified: Secondary | ICD-10-CM | POA: Diagnosis not present

## 2016-09-29 DIAGNOSIS — Z713 Dietary counseling and surveillance: Secondary | ICD-10-CM | POA: Diagnosis not present

## 2016-09-29 DIAGNOSIS — R946 Abnormal results of thyroid function studies: Secondary | ICD-10-CM | POA: Diagnosis not present

## 2016-09-29 DIAGNOSIS — Z0000 Encounter for general adult medical examination without abnormal findings: Secondary | ICD-10-CM | POA: Diagnosis not present

## 2016-10-04 DIAGNOSIS — Z79899 Other long term (current) drug therapy: Secondary | ICD-10-CM | POA: Diagnosis not present

## 2016-10-04 DIAGNOSIS — I4891 Unspecified atrial fibrillation: Secondary | ICD-10-CM | POA: Diagnosis present

## 2016-10-04 DIAGNOSIS — K219 Gastro-esophageal reflux disease without esophagitis: Secondary | ICD-10-CM | POA: Diagnosis present

## 2016-10-04 DIAGNOSIS — Z881 Allergy status to other antibiotic agents status: Secondary | ICD-10-CM | POA: Diagnosis not present

## 2016-10-04 DIAGNOSIS — K4130 Unilateral femoral hernia, with obstruction, without gangrene, not specified as recurrent: Secondary | ICD-10-CM | POA: Diagnosis present

## 2016-10-04 DIAGNOSIS — K56699 Other intestinal obstruction unspecified as to partial versus complete obstruction: Secondary | ICD-10-CM | POA: Diagnosis not present

## 2016-10-04 DIAGNOSIS — R110 Nausea: Secondary | ICD-10-CM | POA: Diagnosis present

## 2016-10-04 DIAGNOSIS — R64 Cachexia: Secondary | ICD-10-CM | POA: Diagnosis present

## 2016-10-04 DIAGNOSIS — R079 Chest pain, unspecified: Secondary | ICD-10-CM | POA: Diagnosis not present

## 2016-10-04 DIAGNOSIS — Z681 Body mass index (BMI) 19 or less, adult: Secondary | ICD-10-CM | POA: Diagnosis not present

## 2016-10-04 DIAGNOSIS — F419 Anxiety disorder, unspecified: Secondary | ICD-10-CM | POA: Diagnosis present

## 2016-10-04 DIAGNOSIS — Z7982 Long term (current) use of aspirin: Secondary | ICD-10-CM | POA: Diagnosis not present

## 2016-10-04 DIAGNOSIS — Z8249 Family history of ischemic heart disease and other diseases of the circulatory system: Secondary | ICD-10-CM | POA: Diagnosis not present

## 2016-10-04 DIAGNOSIS — K4030 Unilateral inguinal hernia, with obstruction, without gangrene, not specified as recurrent: Secondary | ICD-10-CM | POA: Diagnosis not present

## 2016-10-04 DIAGNOSIS — K4090 Unilateral inguinal hernia, without obstruction or gangrene, not specified as recurrent: Secondary | ICD-10-CM | POA: Diagnosis not present

## 2016-10-04 DIAGNOSIS — I10 Essential (primary) hypertension: Secondary | ICD-10-CM | POA: Diagnosis present

## 2016-10-04 DIAGNOSIS — K56609 Unspecified intestinal obstruction, unspecified as to partial versus complete obstruction: Secondary | ICD-10-CM | POA: Diagnosis not present

## 2016-10-04 DIAGNOSIS — M1990 Unspecified osteoarthritis, unspecified site: Secondary | ICD-10-CM | POA: Diagnosis present

## 2016-10-04 DIAGNOSIS — K4190 Unilateral femoral hernia, without obstruction or gangrene, not specified as recurrent: Secondary | ICD-10-CM | POA: Diagnosis not present

## 2016-10-05 NOTE — Progress Notes (Deleted)
Cardiology Office Note  Date: 10/05/2016   ID: Erica [name], DOB 03/03/1928, MRN [id number]  PCP: Kirstie PeriSHAH,ASHISH, MD  Primary Cardiologist: Nona DellSamuel Meylin Stenzel, MD   No chief complaint on file.   History of Present Illness: Erica [name] is an 83 y.o. female last seen in September 2017.  Past Medical History:  Diagnosis Date  . Atrial fibrillation (HCC)    Declines Coumadin  . Atrial flutter (HCC)    Status post RFA  . Interstitial lung disease (HCC)    Mild, DLCO of 68%    Past Surgical History:  Procedure Laterality Date  . LAPAROSCOPIC INGUINAL HERNIA REPAIR Right 01/15/2014    Current Outpatient Prescriptions  Medication Sig Dispense Refill  . alendronate (FOSAMAX) 70 MG tablet Take 70 mg by mouth once a week. Take with a full glass of water on an empty stomach.    . ALPRAZolam (XANAX) 0.25 MG tablet Take 0.25 mg by mouth 2 (two) times daily as needed.    Marland Kitchen. amiodarone (PACERONE) 200 MG tablet Take 100 mg by mouth daily.     Marland Kitchen. aspirin 325 MG tablet Take 325 mg by mouth daily.      . cholecalciferol (VITAMIN D) 1000 units tablet Take 1,000 Units by mouth daily.    . hydrochlorothiazide (MICROZIDE) 12.5 MG capsule Take 12.5 mg by mouth daily.      Marland Kitchen. lisinopril (PRINIVIL,ZESTRIL) 10 MG tablet Take 5 mg by mouth daily.     . Multiple Vitamin (MULTIVITAMIN) tablet Take 1 tablet by mouth daily.    Marland Kitchen. omeprazole (PRILOSEC) 20 MG capsule Take 20 mg by mouth 2 (two) times daily.     Bertram Gala. Polyethyl Glycol-Propyl Glycol (SYSTANE OP) Apply 2 drops to eye daily.    . polyethylene glycol (MIRALAX / GLYCOLAX) packet Take 17 g by mouth daily.     No current facility-administered medications for this visit.    Allergies:  Ciprofloxacin   Social History: The patient  reports that she has never smoked. She has never used smokeless tobacco. She reports that she does not drink alcohol or use drugs.   Family History: The patient's family history is not on file.   ROS:  Please see the  history of present illness. Otherwise, complete review of systems is positive for {NONE DEFAULTED:18576::"none"}.  All other systems are reviewed and negative.   Physical Exam: VS:  There were no vitals taken for this visit., BMI There is no height or weight on file to calculate BMI.  Wt Readings from Last 3 Encounters:  05/05/16 98 lb (44.5 kg)  02/03/16 103 lb (46.7 kg)  11/04/15 108 lb 12.8 oz (49.4 kg)    Thin elderly woman, appears comfortable.  HEENT: Conjunctiva and lids normal, oropharynx clear.  Neck: Supple, no elevated JVP or carotid bruits, no thyromegaly.  Lungs: Clear to auscultation, nonlabored breathing at rest.  Cardiac: Regular rate and rhythm, no S3, soft systolic murmur.  Abdomen: Soft, nontender, bowel sounds present.  Extremities: No pitting edema, distal pulses 2+.   ECG: I personally reviewed the tracing from [facility] which showed sinus rhythm with IVCD of left bundle branch block type.  Recent Labwork:  January 2018: TSH 5.06 December 2015: BUN 2, creatinine 0.4, potassium 4.6, AST 31, ALT 15, hemoglobin 11.5, platelets 249  Other Studies Reviewed Today:   Assessment and Plan:    Current medicines were reviewed with the patient today.  No orders of the defined types were placed in this encounter.  Disposition:  Signed, Jonelle Sidle, MD, [facility] 10/05/2016 3:13 PM    [facility] at [facility] [address] [place], [place], [place] [address] Phone: [phone]; Fax: [phone]

## 2016-10-07 ENCOUNTER — Ambulatory Visit: Payer: PRIVATE HEALTH INSURANCE | Admitting: Cardiology

## 2016-10-09 DIAGNOSIS — K432 Incisional hernia without obstruction or gangrene: Secondary | ICD-10-CM | POA: Diagnosis not present

## 2016-10-09 DIAGNOSIS — Z7982 Long term (current) use of aspirin: Secondary | ICD-10-CM | POA: Diagnosis not present

## 2016-10-09 DIAGNOSIS — M130 Polyarthritis, unspecified: Secondary | ICD-10-CM | POA: Diagnosis not present

## 2016-10-09 DIAGNOSIS — E7800 Pure hypercholesterolemia, unspecified: Secondary | ICD-10-CM | POA: Diagnosis not present

## 2016-10-09 DIAGNOSIS — Z48815 Encounter for surgical aftercare following surgery on the digestive system: Secondary | ICD-10-CM | POA: Diagnosis not present

## 2016-10-09 DIAGNOSIS — K219 Gastro-esophageal reflux disease without esophagitis: Secondary | ICD-10-CM | POA: Diagnosis not present

## 2016-10-09 DIAGNOSIS — R339 Retention of urine, unspecified: Secondary | ICD-10-CM | POA: Diagnosis not present

## 2016-10-09 DIAGNOSIS — I10 Essential (primary) hypertension: Secondary | ICD-10-CM | POA: Diagnosis not present

## 2016-10-09 DIAGNOSIS — R1030 Lower abdominal pain, unspecified: Secondary | ICD-10-CM | POA: Diagnosis not present

## 2016-10-09 DIAGNOSIS — R262 Difficulty in walking, not elsewhere classified: Secondary | ICD-10-CM | POA: Diagnosis not present

## 2016-10-09 DIAGNOSIS — J449 Chronic obstructive pulmonary disease, unspecified: Secondary | ICD-10-CM | POA: Diagnosis not present

## 2016-10-09 DIAGNOSIS — S43395A Dislocation of other parts of left shoulder girdle, initial encounter: Secondary | ICD-10-CM | POA: Diagnosis not present

## 2016-10-09 DIAGNOSIS — K5740 Diverticulitis of both small and large intestine with perforation and abscess without bleeding: Secondary | ICD-10-CM | POA: Diagnosis not present

## 2016-10-09 DIAGNOSIS — I4892 Unspecified atrial flutter: Secondary | ICD-10-CM | POA: Diagnosis not present

## 2016-10-09 DIAGNOSIS — K56609 Unspecified intestinal obstruction, unspecified as to partial versus complete obstruction: Secondary | ICD-10-CM | POA: Diagnosis not present

## 2016-10-09 DIAGNOSIS — F419 Anxiety disorder, unspecified: Secondary | ICD-10-CM | POA: Diagnosis not present

## 2016-10-09 DIAGNOSIS — K458 Other specified abdominal hernia without obstruction or gangrene: Secondary | ICD-10-CM | POA: Diagnosis not present

## 2016-10-09 DIAGNOSIS — S42224D 2-part nondisplaced fracture of surgical neck of right humerus, subsequent encounter for fracture with routine healing: Secondary | ICD-10-CM | POA: Diagnosis not present

## 2016-10-09 DIAGNOSIS — N1330 Unspecified hydronephrosis: Secondary | ICD-10-CM | POA: Diagnosis not present

## 2016-10-09 DIAGNOSIS — Z79899 Other long term (current) drug therapy: Secondary | ICD-10-CM | POA: Diagnosis not present

## 2016-10-09 DIAGNOSIS — Z8249 Family history of ischemic heart disease and other diseases of the circulatory system: Secondary | ICD-10-CM | POA: Diagnosis not present

## 2016-10-09 DIAGNOSIS — M1990 Unspecified osteoarthritis, unspecified site: Secondary | ICD-10-CM | POA: Diagnosis not present

## 2016-10-09 DIAGNOSIS — Z9889 Other specified postprocedural states: Secondary | ICD-10-CM | POA: Diagnosis not present

## 2016-10-12 DIAGNOSIS — I4892 Unspecified atrial flutter: Secondary | ICD-10-CM | POA: Diagnosis not present

## 2016-10-12 DIAGNOSIS — K56609 Unspecified intestinal obstruction, unspecified as to partial versus complete obstruction: Secondary | ICD-10-CM | POA: Diagnosis not present

## 2016-10-12 DIAGNOSIS — I10 Essential (primary) hypertension: Secondary | ICD-10-CM | POA: Diagnosis not present

## 2016-10-12 DIAGNOSIS — K458 Other specified abdominal hernia without obstruction or gangrene: Secondary | ICD-10-CM | POA: Diagnosis not present

## 2016-10-13 DIAGNOSIS — K567 Ileus, unspecified: Secondary | ICD-10-CM | POA: Diagnosis not present

## 2016-10-13 DIAGNOSIS — K219 Gastro-esophageal reflux disease without esophagitis: Secondary | ICD-10-CM | POA: Diagnosis not present

## 2016-10-13 DIAGNOSIS — R262 Difficulty in walking, not elsewhere classified: Secondary | ICD-10-CM | POA: Diagnosis not present

## 2016-10-13 DIAGNOSIS — I4892 Unspecified atrial flutter: Secondary | ICD-10-CM | POA: Diagnosis not present

## 2016-10-13 DIAGNOSIS — I10 Essential (primary) hypertension: Secondary | ICD-10-CM | POA: Diagnosis not present

## 2016-10-13 DIAGNOSIS — R339 Retention of urine, unspecified: Secondary | ICD-10-CM | POA: Diagnosis not present

## 2016-10-13 DIAGNOSIS — Z48815 Encounter for surgical aftercare following surgery on the digestive system: Secondary | ICD-10-CM | POA: Diagnosis not present

## 2016-10-13 DIAGNOSIS — K56609 Unspecified intestinal obstruction, unspecified as to partial versus complete obstruction: Secondary | ICD-10-CM | POA: Diagnosis not present

## 2016-10-13 DIAGNOSIS — R600 Localized edema: Secondary | ICD-10-CM | POA: Diagnosis not present

## 2016-10-13 DIAGNOSIS — K432 Incisional hernia without obstruction or gangrene: Secondary | ICD-10-CM | POA: Diagnosis not present

## 2016-10-13 DIAGNOSIS — K458 Other specified abdominal hernia without obstruction or gangrene: Secondary | ICD-10-CM | POA: Diagnosis not present

## 2016-10-18 DIAGNOSIS — K567 Ileus, unspecified: Secondary | ICD-10-CM | POA: Diagnosis not present

## 2016-10-18 DIAGNOSIS — R339 Retention of urine, unspecified: Secondary | ICD-10-CM | POA: Diagnosis not present

## 2016-10-18 DIAGNOSIS — I10 Essential (primary) hypertension: Secondary | ICD-10-CM | POA: Diagnosis not present

## 2016-10-18 DIAGNOSIS — K219 Gastro-esophageal reflux disease without esophagitis: Secondary | ICD-10-CM | POA: Diagnosis not present

## 2016-10-19 DIAGNOSIS — R339 Retention of urine, unspecified: Secondary | ICD-10-CM | POA: Diagnosis not present

## 2016-10-19 DIAGNOSIS — K567 Ileus, unspecified: Secondary | ICD-10-CM | POA: Diagnosis not present

## 2016-10-19 DIAGNOSIS — I10 Essential (primary) hypertension: Secondary | ICD-10-CM | POA: Diagnosis not present

## 2016-10-20 DIAGNOSIS — K567 Ileus, unspecified: Secondary | ICD-10-CM | POA: Diagnosis not present

## 2016-10-20 DIAGNOSIS — R339 Retention of urine, unspecified: Secondary | ICD-10-CM | POA: Diagnosis not present

## 2016-10-20 DIAGNOSIS — I10 Essential (primary) hypertension: Secondary | ICD-10-CM | POA: Diagnosis not present

## 2016-11-09 DIAGNOSIS — K219 Gastro-esophageal reflux disease without esophagitis: Secondary | ICD-10-CM | POA: Diagnosis not present

## 2016-11-09 DIAGNOSIS — I4892 Unspecified atrial flutter: Secondary | ICD-10-CM | POA: Diagnosis not present

## 2016-11-09 DIAGNOSIS — K567 Ileus, unspecified: Secondary | ICD-10-CM | POA: Diagnosis not present

## 2016-11-09 DIAGNOSIS — R339 Retention of urine, unspecified: Secondary | ICD-10-CM | POA: Diagnosis not present

## 2016-11-13 DIAGNOSIS — K4130 Unilateral femoral hernia, with obstruction, without gangrene, not specified as recurrent: Secondary | ICD-10-CM | POA: Diagnosis not present

## 2016-11-13 DIAGNOSIS — K567 Ileus, unspecified: Secondary | ICD-10-CM | POA: Diagnosis not present

## 2016-11-13 DIAGNOSIS — I10 Essential (primary) hypertension: Secondary | ICD-10-CM | POA: Diagnosis not present

## 2016-11-13 DIAGNOSIS — M6281 Muscle weakness (generalized): Secondary | ICD-10-CM | POA: Diagnosis not present

## 2016-11-13 DIAGNOSIS — R339 Retention of urine, unspecified: Secondary | ICD-10-CM | POA: Diagnosis not present

## 2016-11-16 DIAGNOSIS — E559 Vitamin D deficiency, unspecified: Secondary | ICD-10-CM | POA: Diagnosis not present

## 2016-11-16 DIAGNOSIS — Z299 Encounter for prophylactic measures, unspecified: Secondary | ICD-10-CM | POA: Diagnosis not present

## 2016-11-16 DIAGNOSIS — E7800 Pure hypercholesterolemia, unspecified: Secondary | ICD-10-CM | POA: Diagnosis not present

## 2016-11-16 DIAGNOSIS — K567 Ileus, unspecified: Secondary | ICD-10-CM | POA: Diagnosis not present

## 2016-11-16 DIAGNOSIS — F419 Anxiety disorder, unspecified: Secondary | ICD-10-CM | POA: Diagnosis not present

## 2016-11-16 DIAGNOSIS — Z6820 Body mass index (BMI) 20.0-20.9, adult: Secondary | ICD-10-CM | POA: Diagnosis not present

## 2016-11-16 DIAGNOSIS — Z713 Dietary counseling and surveillance: Secondary | ICD-10-CM | POA: Diagnosis not present

## 2016-11-16 DIAGNOSIS — Z789 Other specified health status: Secondary | ICD-10-CM | POA: Diagnosis not present

## 2016-11-16 DIAGNOSIS — R339 Retention of urine, unspecified: Secondary | ICD-10-CM | POA: Diagnosis not present

## 2016-11-16 DIAGNOSIS — E039 Hypothyroidism, unspecified: Secondary | ICD-10-CM | POA: Diagnosis not present

## 2016-11-16 DIAGNOSIS — K4130 Unilateral femoral hernia, with obstruction, without gangrene, not specified as recurrent: Secondary | ICD-10-CM | POA: Diagnosis not present

## 2016-11-16 DIAGNOSIS — I10 Essential (primary) hypertension: Secondary | ICD-10-CM | POA: Diagnosis not present

## 2016-11-16 DIAGNOSIS — M6281 Muscle weakness (generalized): Secondary | ICD-10-CM | POA: Diagnosis not present

## 2016-11-17 DIAGNOSIS — K4130 Unilateral femoral hernia, with obstruction, without gangrene, not specified as recurrent: Secondary | ICD-10-CM | POA: Diagnosis not present

## 2016-11-17 DIAGNOSIS — I10 Essential (primary) hypertension: Secondary | ICD-10-CM | POA: Diagnosis not present

## 2016-11-17 DIAGNOSIS — K567 Ileus, unspecified: Secondary | ICD-10-CM | POA: Diagnosis not present

## 2016-11-17 DIAGNOSIS — R339 Retention of urine, unspecified: Secondary | ICD-10-CM | POA: Diagnosis not present

## 2016-11-17 DIAGNOSIS — M6281 Muscle weakness (generalized): Secondary | ICD-10-CM | POA: Diagnosis not present

## 2016-11-20 DIAGNOSIS — K567 Ileus, unspecified: Secondary | ICD-10-CM | POA: Diagnosis not present

## 2016-11-20 DIAGNOSIS — K4130 Unilateral femoral hernia, with obstruction, without gangrene, not specified as recurrent: Secondary | ICD-10-CM | POA: Diagnosis not present

## 2016-11-20 DIAGNOSIS — M6281 Muscle weakness (generalized): Secondary | ICD-10-CM | POA: Diagnosis not present

## 2016-11-20 DIAGNOSIS — I10 Essential (primary) hypertension: Secondary | ICD-10-CM | POA: Diagnosis not present

## 2016-11-20 DIAGNOSIS — R339 Retention of urine, unspecified: Secondary | ICD-10-CM | POA: Diagnosis not present

## 2016-11-24 DIAGNOSIS — I10 Essential (primary) hypertension: Secondary | ICD-10-CM | POA: Diagnosis not present

## 2016-11-24 DIAGNOSIS — K4130 Unilateral femoral hernia, with obstruction, without gangrene, not specified as recurrent: Secondary | ICD-10-CM | POA: Diagnosis not present

## 2016-11-24 DIAGNOSIS — R339 Retention of urine, unspecified: Secondary | ICD-10-CM | POA: Diagnosis not present

## 2016-11-24 DIAGNOSIS — K567 Ileus, unspecified: Secondary | ICD-10-CM | POA: Diagnosis not present

## 2016-11-24 DIAGNOSIS — M6281 Muscle weakness (generalized): Secondary | ICD-10-CM | POA: Diagnosis not present

## 2016-11-26 DIAGNOSIS — M6281 Muscle weakness (generalized): Secondary | ICD-10-CM | POA: Diagnosis not present

## 2016-11-26 DIAGNOSIS — I10 Essential (primary) hypertension: Secondary | ICD-10-CM | POA: Diagnosis not present

## 2016-11-26 DIAGNOSIS — K567 Ileus, unspecified: Secondary | ICD-10-CM | POA: Diagnosis not present

## 2016-11-26 DIAGNOSIS — R339 Retention of urine, unspecified: Secondary | ICD-10-CM | POA: Diagnosis not present

## 2016-11-26 DIAGNOSIS — K4130 Unilateral femoral hernia, with obstruction, without gangrene, not specified as recurrent: Secondary | ICD-10-CM | POA: Diagnosis not present

## 2016-12-04 DIAGNOSIS — E7800 Pure hypercholesterolemia, unspecified: Secondary | ICD-10-CM | POA: Diagnosis not present

## 2016-12-04 DIAGNOSIS — I4891 Unspecified atrial fibrillation: Secondary | ICD-10-CM | POA: Diagnosis not present

## 2016-12-04 DIAGNOSIS — I10 Essential (primary) hypertension: Secondary | ICD-10-CM | POA: Diagnosis not present

## 2016-12-10 DIAGNOSIS — H353211 Exudative age-related macular degeneration, right eye, with active choroidal neovascularization: Secondary | ICD-10-CM | POA: Diagnosis not present

## 2016-12-28 DIAGNOSIS — F419 Anxiety disorder, unspecified: Secondary | ICD-10-CM | POA: Diagnosis not present

## 2016-12-28 DIAGNOSIS — M810 Age-related osteoporosis without current pathological fracture: Secondary | ICD-10-CM | POA: Diagnosis not present

## 2016-12-28 DIAGNOSIS — E7800 Pure hypercholesterolemia, unspecified: Secondary | ICD-10-CM | POA: Diagnosis not present

## 2016-12-28 DIAGNOSIS — I10 Essential (primary) hypertension: Secondary | ICD-10-CM | POA: Diagnosis not present

## 2016-12-28 DIAGNOSIS — Z713 Dietary counseling and surveillance: Secondary | ICD-10-CM | POA: Diagnosis not present

## 2016-12-28 DIAGNOSIS — Z299 Encounter for prophylactic measures, unspecified: Secondary | ICD-10-CM | POA: Diagnosis not present

## 2016-12-28 DIAGNOSIS — I4892 Unspecified atrial flutter: Secondary | ICD-10-CM | POA: Diagnosis not present

## 2016-12-28 DIAGNOSIS — J849 Interstitial pulmonary disease, unspecified: Secondary | ICD-10-CM | POA: Diagnosis not present

## 2016-12-28 DIAGNOSIS — K210 Gastro-esophageal reflux disease with esophagitis: Secondary | ICD-10-CM | POA: Diagnosis not present

## 2016-12-28 DIAGNOSIS — Z681 Body mass index (BMI) 19 or less, adult: Secondary | ICD-10-CM | POA: Diagnosis not present

## 2016-12-28 DIAGNOSIS — E039 Hypothyroidism, unspecified: Secondary | ICD-10-CM | POA: Diagnosis not present

## 2016-12-28 DIAGNOSIS — I209 Angina pectoris, unspecified: Secondary | ICD-10-CM | POA: Diagnosis not present

## 2016-12-29 NOTE — Progress Notes (Signed)
Cardiology Office Note  Date: 12/30/2016   ID: Erica [name], DOB November 18, 1927, MRN [id number]  PCP: Kirstie Peri, MD  Primary Cardiologist: Nona Dell, MD   Chief Complaint  Patient presents with  . Atrial Fibrillation    History of Present Illness: Erica [name] is an 83 y.o. female last seen in September 2017. She is here today with her daughter for a follow-up visit. She reports no palpitations. She underwent hernia surgery a little over a month ago, did not have any obvious arrhythmia issues perioperatively. She remains on amiodarone and aspirin.  I personally reviewed her ECG today which shows sinus rhythm with increased voltage.  She was previously on lisinopril, was taken off of the medication due to low blood pressures. She is checking her blood pressure at home now to make sure that trend does not go back up.  Past Medical History:  Diagnosis Date  . Atrial fibrillation (HCC)    Declines Coumadin  . Atrial flutter (HCC)    Status post RFA  . Interstitial lung disease (HCC)    Mild, DLCO of 68%    Past Surgical History:  Procedure Laterality Date  . LAPAROSCOPIC INGUINAL HERNIA REPAIR Right 01/15/2014    Current Outpatient Prescriptions  Medication Sig Dispense Refill  . ALPRAZolam (XANAX) 0.25 MG tablet Take 0.25 mg by mouth 2 (two) times daily as needed.    Marland Kitchen amiodarone (PACERONE) 200 MG tablet Take 100 mg by mouth daily.     Marland Kitchen aspirin 325 MG tablet Take 325 mg by mouth daily.      . cholecalciferol (VITAMIN D) 1000 units tablet Take 1,000 Units by mouth daily.    Marland Kitchen levothyroxine (SYNTHROID, LEVOTHROID) 50 MCG tablet Take 50 mcg by mouth daily before breakfast.    . Multiple Vitamin (MULTIVITAMIN) tablet Take 1 tablet by mouth daily.    Marland Kitchen omeprazole (PRILOSEC) 20 MG capsule Take 20 mg by mouth 2 (two) times daily.     Bertram Gala Glycol-Propyl Glycol (SYSTANE OP) Apply 2 drops to eye daily.    . polyethylene glycol (MIRALAX / GLYCOLAX) packet Take  17 g by mouth daily.     No current facility-administered medications for this visit.    Allergies:  Ciprofloxacin   Social History: The patient  reports that she has never smoked. She has never used smokeless tobacco. She reports that she does not drink alcohol or use drugs.   ROS:  Please see the history of present illness. Otherwise, complete review of systems is positive for hearing loss.  All other systems are reviewed and negative.   Physical Exam: VS:  BP (!) 142/70   Pulse 66   Ht  (1.6 m)   Wt 107 lb (48.5 kg)   SpO2 99%   BMI 18.95 kg/m , BMI Body mass index is 18.95 kg/m.  Wt Readings from Last 3 Encounters:  12/30/16 107 lb (48.5 kg)  05/05/16 98 lb (44.5 kg)  02/03/16 103 lb (46.7 kg)    Thin elderly woman, appears comfortable.  HEENT: Conjunctiva and lids normal, oropharynx clear.  Neck: Supple, no elevated JVP or carotid bruits, no thyromegaly.  Lungs: Clear to auscultation, nonlabored breathing at rest.  Cardiac: Regular rate and rhythm, no S3, soft systolic murmur.  Abdomen: Soft, nontender, bowel sounds present.  Extremities: No pitting edema, distal pulses 2+.   ECG: I personally reviewed the tracing from 10/10/2015 at [facility] which showed sinus rhythm with IVCD of left bundle branch block type.  Recent Labwork:  January 2018: TSH 5.06 December 2015: BUN 2, creatinine 0.4, potassium 4.6, AST 28, ALT 17, hemoglobin 11.5, platelets 249  Assessment and Plan:  1. Paroxysmal atrial fibrillation and prior history of atrial flutter with radiofrequency ablation. She has declined anticoagulation long-term, overall doing well on aspirin and amiodarone. ECG reviewed. She is following lab work regularly with Dr. Sherryll Burger.  2. Essential hypertension. She has come off of lisinopril. I asked her to continue to check blood pressure periodically to see if the trend goes back up.  Current medicines were reviewed with the patient today.   Orders Placed This  Encounter  Procedures  . EKG 12-Lead    Disposition: Follow-up in 6 months.  Signed, Jonelle Sidle, MD, [facility] 12/30/2016 4:16 PM    [facility] at [facility] [address] [place], [place], [place] [address] Phone: [phone]; Fax: [phone]

## 2016-12-30 ENCOUNTER — Encounter: Payer: Self-pay | Admitting: Cardiology

## 2016-12-30 ENCOUNTER — Ambulatory Visit (INDEPENDENT_AMBULATORY_CARE_PROVIDER_SITE_OTHER): Payer: Medicare Other | Admitting: Cardiology

## 2016-12-30 VITALS — BP 142/70 | HR 66 | Ht 63.0 in | Wt 107.0 lb

## 2016-12-30 DIAGNOSIS — I10 Essential (primary) hypertension: Secondary | ICD-10-CM | POA: Diagnosis not present

## 2016-12-30 DIAGNOSIS — I4891 Unspecified atrial fibrillation: Secondary | ICD-10-CM | POA: Diagnosis not present

## 2016-12-30 DIAGNOSIS — E7800 Pure hypercholesterolemia, unspecified: Secondary | ICD-10-CM | POA: Diagnosis not present

## 2016-12-30 DIAGNOSIS — I480 Paroxysmal atrial fibrillation: Secondary | ICD-10-CM | POA: Diagnosis not present

## 2016-12-30 NOTE — Patient Instructions (Signed)

## 2017-01-11 DIAGNOSIS — H25812 Combined forms of age-related cataract, left eye: Secondary | ICD-10-CM | POA: Diagnosis not present

## 2017-01-11 DIAGNOSIS — H353122 Nonexudative age-related macular degeneration, left eye, intermediate dry stage: Secondary | ICD-10-CM | POA: Diagnosis not present

## 2017-01-11 DIAGNOSIS — H401132 Primary open-angle glaucoma, bilateral, moderate stage: Secondary | ICD-10-CM | POA: Diagnosis not present

## 2017-01-11 DIAGNOSIS — H353211 Exudative age-related macular degeneration, right eye, with active choroidal neovascularization: Secondary | ICD-10-CM | POA: Diagnosis not present

## 2017-02-04 DIAGNOSIS — H353211 Exudative age-related macular degeneration, right eye, with active choroidal neovascularization: Secondary | ICD-10-CM | POA: Diagnosis not present

## 2017-02-04 DIAGNOSIS — H353122 Nonexudative age-related macular degeneration, left eye, intermediate dry stage: Secondary | ICD-10-CM | POA: Diagnosis not present

## 2017-02-17 DIAGNOSIS — F419 Anxiety disorder, unspecified: Secondary | ICD-10-CM | POA: Diagnosis not present

## 2017-02-17 DIAGNOSIS — Z299 Encounter for prophylactic measures, unspecified: Secondary | ICD-10-CM | POA: Diagnosis not present

## 2017-02-17 DIAGNOSIS — I10 Essential (primary) hypertension: Secondary | ICD-10-CM | POA: Diagnosis not present

## 2017-02-17 DIAGNOSIS — E7800 Pure hypercholesterolemia, unspecified: Secondary | ICD-10-CM | POA: Diagnosis not present

## 2017-02-17 DIAGNOSIS — E039 Hypothyroidism, unspecified: Secondary | ICD-10-CM | POA: Diagnosis not present

## 2017-02-17 DIAGNOSIS — Z6822 Body mass index (BMI) 22.0-22.9, adult: Secondary | ICD-10-CM | POA: Diagnosis not present

## 2017-02-17 DIAGNOSIS — I4892 Unspecified atrial flutter: Secondary | ICD-10-CM | POA: Diagnosis not present

## 2017-02-17 DIAGNOSIS — Z713 Dietary counseling and surveillance: Secondary | ICD-10-CM | POA: Diagnosis not present

## 2017-02-17 DIAGNOSIS — I209 Angina pectoris, unspecified: Secondary | ICD-10-CM | POA: Diagnosis not present

## 2017-03-05 ENCOUNTER — Other Ambulatory Visit: Payer: Self-pay

## 2017-03-05 MED ORDER — ASPIRIN 325 MG PO TABS: 325.0000 mg | ORAL_TABLET | Freq: Every day | ORAL | 3 refills | Status: DC

## 2017-03-10 DIAGNOSIS — E7800 Pure hypercholesterolemia, unspecified: Secondary | ICD-10-CM | POA: Diagnosis not present

## 2017-03-10 DIAGNOSIS — I10 Essential (primary) hypertension: Secondary | ICD-10-CM | POA: Diagnosis not present

## 2017-03-10 DIAGNOSIS — I4891 Unspecified atrial fibrillation: Secondary | ICD-10-CM | POA: Diagnosis not present

## 2017-03-16 DIAGNOSIS — H353211 Exudative age-related macular degeneration, right eye, with active choroidal neovascularization: Secondary | ICD-10-CM | POA: Diagnosis not present

## 2017-03-16 DIAGNOSIS — H353122 Nonexudative age-related macular degeneration, left eye, intermediate dry stage: Secondary | ICD-10-CM | POA: Diagnosis not present

## 2017-03-16 DIAGNOSIS — Z961 Presence of intraocular lens: Secondary | ICD-10-CM | POA: Diagnosis not present

## 2017-03-16 DIAGNOSIS — H25812 Combined forms of age-related cataract, left eye: Secondary | ICD-10-CM | POA: Diagnosis not present

## 2017-04-05 DIAGNOSIS — I10 Essential (primary) hypertension: Secondary | ICD-10-CM | POA: Diagnosis not present

## 2017-04-05 DIAGNOSIS — I4891 Unspecified atrial fibrillation: Secondary | ICD-10-CM | POA: Diagnosis not present

## 2017-04-05 DIAGNOSIS — E7800 Pure hypercholesterolemia, unspecified: Secondary | ICD-10-CM | POA: Diagnosis not present

## 2017-04-15 DIAGNOSIS — H353211 Exudative age-related macular degeneration, right eye, with active choroidal neovascularization: Secondary | ICD-10-CM | POA: Diagnosis not present

## 2017-05-17 DIAGNOSIS — H25812 Combined forms of age-related cataract, left eye: Secondary | ICD-10-CM | POA: Diagnosis not present

## 2017-05-21 DIAGNOSIS — I10 Essential (primary) hypertension: Secondary | ICD-10-CM | POA: Diagnosis not present

## 2017-05-21 DIAGNOSIS — E7800 Pure hypercholesterolemia, unspecified: Secondary | ICD-10-CM | POA: Diagnosis not present

## 2017-05-21 DIAGNOSIS — I4891 Unspecified atrial fibrillation: Secondary | ICD-10-CM | POA: Diagnosis not present

## 2017-05-26 DIAGNOSIS — M7061 Trochanteric bursitis, right hip: Secondary | ICD-10-CM | POA: Diagnosis not present

## 2017-05-26 DIAGNOSIS — Z299 Encounter for prophylactic measures, unspecified: Secondary | ICD-10-CM | POA: Diagnosis not present

## 2017-05-26 DIAGNOSIS — E7800 Pure hypercholesterolemia, unspecified: Secondary | ICD-10-CM | POA: Diagnosis not present

## 2017-05-26 DIAGNOSIS — I4892 Unspecified atrial flutter: Secondary | ICD-10-CM | POA: Diagnosis not present

## 2017-05-26 DIAGNOSIS — I10 Essential (primary) hypertension: Secondary | ICD-10-CM | POA: Diagnosis not present

## 2017-05-26 DIAGNOSIS — J849 Interstitial pulmonary disease, unspecified: Secondary | ICD-10-CM | POA: Diagnosis not present

## 2017-05-26 DIAGNOSIS — E039 Hypothyroidism, unspecified: Secondary | ICD-10-CM | POA: Diagnosis not present

## 2017-05-26 DIAGNOSIS — I209 Angina pectoris, unspecified: Secondary | ICD-10-CM | POA: Diagnosis not present

## 2017-05-26 DIAGNOSIS — K210 Gastro-esophageal reflux disease with esophagitis: Secondary | ICD-10-CM | POA: Diagnosis not present

## 2017-05-26 DIAGNOSIS — Z681 Body mass index (BMI) 19 or less, adult: Secondary | ICD-10-CM | POA: Diagnosis not present

## 2017-05-26 DIAGNOSIS — F419 Anxiety disorder, unspecified: Secondary | ICD-10-CM | POA: Diagnosis not present

## 2017-05-26 DIAGNOSIS — M810 Age-related osteoporosis without current pathological fracture: Secondary | ICD-10-CM | POA: Diagnosis not present

## 2017-06-02 DIAGNOSIS — Z7982 Long term (current) use of aspirin: Secondary | ICD-10-CM | POA: Diagnosis not present

## 2017-06-02 DIAGNOSIS — K219 Gastro-esophageal reflux disease without esophagitis: Secondary | ICD-10-CM | POA: Diagnosis not present

## 2017-06-02 DIAGNOSIS — I4891 Unspecified atrial fibrillation: Secondary | ICD-10-CM | POA: Diagnosis not present

## 2017-06-02 DIAGNOSIS — H25812 Combined forms of age-related cataract, left eye: Secondary | ICD-10-CM | POA: Diagnosis not present

## 2017-06-02 DIAGNOSIS — Z961 Presence of intraocular lens: Secondary | ICD-10-CM | POA: Diagnosis not present

## 2017-06-02 DIAGNOSIS — Z9841 Cataract extraction status, right eye: Secondary | ICD-10-CM | POA: Diagnosis not present

## 2017-06-02 DIAGNOSIS — Z881 Allergy status to other antibiotic agents status: Secondary | ICD-10-CM | POA: Diagnosis not present

## 2017-06-02 DIAGNOSIS — E039 Hypothyroidism, unspecified: Secondary | ICD-10-CM | POA: Diagnosis not present

## 2017-06-02 DIAGNOSIS — F419 Anxiety disorder, unspecified: Secondary | ICD-10-CM | POA: Diagnosis not present

## 2017-06-02 DIAGNOSIS — H25811 Combined forms of age-related cataract, right eye: Secondary | ICD-10-CM | POA: Diagnosis not present

## 2017-06-02 DIAGNOSIS — H353122 Nonexudative age-related macular degeneration, left eye, intermediate dry stage: Secondary | ICD-10-CM | POA: Diagnosis not present

## 2017-06-14 DIAGNOSIS — E7800 Pure hypercholesterolemia, unspecified: Secondary | ICD-10-CM | POA: Diagnosis not present

## 2017-06-14 DIAGNOSIS — I10 Essential (primary) hypertension: Secondary | ICD-10-CM | POA: Diagnosis not present

## 2017-06-14 DIAGNOSIS — I4891 Unspecified atrial fibrillation: Secondary | ICD-10-CM | POA: Diagnosis not present

## 2017-06-22 DIAGNOSIS — H04123 Dry eye syndrome of bilateral lacrimal glands: Secondary | ICD-10-CM | POA: Diagnosis not present

## 2017-06-22 DIAGNOSIS — H0289 Other specified disorders of eyelid: Secondary | ICD-10-CM | POA: Diagnosis not present

## 2017-06-25 DIAGNOSIS — Z23 Encounter for immunization: Secondary | ICD-10-CM | POA: Diagnosis not present

## 2017-07-02 ENCOUNTER — Ambulatory Visit (INDEPENDENT_AMBULATORY_CARE_PROVIDER_SITE_OTHER): Payer: Medicare Other | Admitting: Cardiology

## 2017-07-02 ENCOUNTER — Encounter: Payer: Self-pay | Admitting: Cardiology

## 2017-07-02 ENCOUNTER — Encounter: Payer: Self-pay | Admitting: *Deleted

## 2017-07-02 VITALS — BP 126/69 | HR 56 | Ht 63.0 in | Wt 105.4 lb

## 2017-07-02 DIAGNOSIS — I480 Paroxysmal atrial fibrillation: Secondary | ICD-10-CM | POA: Diagnosis not present

## 2017-07-02 DIAGNOSIS — I10 Essential (primary) hypertension: Secondary | ICD-10-CM | POA: Diagnosis not present

## 2017-07-02 NOTE — Patient Instructions (Signed)

## 2017-07-02 NOTE — Progress Notes (Signed)
Cardiology Office Note  Date: 07/02/2017   ID: Erica [name], DOB [birth year], MRN [id number]  PCP: Erica [name], Ashish, MD  Primary Cardiologist: Erica DellSamuel McDowell, MD   Chief Complaint  Patient presents with  . PAF    History of Present Illness: Erica SagoBeatrice T Byas is an 83 y.o. female last seen in May.  She is here today for a routine follow-up visit.  He does not report any chest pain or palpitations, no unusual breathlessness, dizziness, or syncope.  We discussed follow-up lab work on amiodarone.  She states that she has seen Erica [name] in the interim with blood work, results to be requested.  She remains on aspirin, has declined anticoagulation therapies.  Past Medical History:  Diagnosis Date  . Atrial fibrillation (HCC)    Declines Coumadin  . Atrial flutter (HCC)    Status post RFA  . Interstitial lung disease (HCC)    Mild, DLCO of 68%    Past Surgical History:  Procedure Laterality Date  . LAPAROSCOPIC INGUINAL HERNIA REPAIR Right 01/15/2014    Current Outpatient Prescriptions  Medication Sig Dispense Refill  . ALPRAZolam (XANAX) 0.25 MG tablet Take 0.25 mg by mouth 2 (two) times daily as needed.    Marland Kitchen. amiodarone (PACERONE) 200 MG tablet Take 100 mg by mouth daily.     Marland Kitchen. aspirin 325 MG tablet Take 1 tablet (325 mg total) by mouth daily. 90 tablet 3  . cholecalciferol (VITAMIN D) 1000 units tablet Take 1,000 Units by mouth daily.    Marland Kitchen. levothyroxine (SYNTHROID, LEVOTHROID) 50 MCG tablet Take 50 mcg by mouth daily before breakfast.    . Multiple Vitamin (MULTIVITAMIN) tablet Take 1 tablet by mouth daily.    Marland Kitchen. omeprazole (PRILOSEC) 20 MG capsule Take 20 mg by mouth 2 (two) times daily.     Bertram Gala. Polyethyl Glycol-Propyl Glycol (SYSTANE OP) Apply 2 drops to eye daily.    . polyethylene glycol (MIRALAX / GLYCOLAX) packet Take 17 g by mouth daily.     No current facility-administered medications for this visit.    Allergies:  Ciprofloxacin   Social History: The patient   reports that she has never smoked. She has never used smokeless tobacco. She reports that she does not drink alcohol or use drugs.   ROS:  Please see the history of present illness. Otherwise, complete review of systems is positive for arthritic pain and stiffness, recent cataract surgery and dental work.  All other systems are reviewed and negative.   Physical Exam: VS:  BP 126/69   Pulse (!) 56   Ht 5\' 3"  (1.6 m)   Wt 105 lb 6.4 oz (47.8 kg)   BMI 18.67 kg/m , BMI Body mass index is 18.67 kg/m.  Wt Readings from Last 3 Encounters:  07/02/17 105 lb 6.4 oz (47.8 kg)  12/30/16 107 lb (48.5 kg)  05/05/16 98 lb (44.5 kg)    General: Elderly woman, appears comfortable at rest.  Using a cane. HEENT: Conjunctiva and lids normal, oropharynx clear with moist mucosa. Neck: Supple, no elevated JVP or carotid bruits, no thyromegaly. Lungs: Clear to auscultation, nonlabored breathing at rest. Cardiac: Regular rate and rhythm, no S3, soft systolic murmur, no pericardial rub. Abdomen: Soft, nontender, bowel sounds present, no guarding or rebound. Extremities: No pitting edema, distal pulses 2+.  ECG: I personally reviewed the tracing from 12/30/2016 which showed sinus rhythm with LVH.  Recent Labwork:  January 2018: TSH 5.06 December 2015: BUN 2, creatinine 0.4, potassium 4.6, AST 28,  ALT 17, hemoglobin 11.5, platelets 249  Assessment and Plan:  1.  Paroxysmal atrial fibrillation and history of atrial flutter status post radiofrequency ablation.  She is doing well without palpitations.  She remains on low-dose amiodarone and aspirin, having declined anticoagulation.  Interval lab work from Dr. Sherryll Burger is being requested.  2.  History of essential hypertension.  She is now off lisinopril, systolic blood pressure in the 120s.  Current medicines were reviewed with the patient today.  Disposition: Follow-up in 6 months.  Signed, Erica Sidle, MD, [facility] 07/02/2017 1:06 PM    [facility] at [facility] [address] [place], [place], [place] [address] Phone: [phone]; Fax: [phone]

## 2017-07-12 DIAGNOSIS — E7800 Pure hypercholesterolemia, unspecified: Secondary | ICD-10-CM | POA: Diagnosis not present

## 2017-07-12 DIAGNOSIS — I10 Essential (primary) hypertension: Secondary | ICD-10-CM | POA: Diagnosis not present

## 2017-07-12 DIAGNOSIS — I4891 Unspecified atrial fibrillation: Secondary | ICD-10-CM | POA: Diagnosis not present

## 2017-08-06 DIAGNOSIS — E7800 Pure hypercholesterolemia, unspecified: Secondary | ICD-10-CM | POA: Diagnosis not present

## 2017-08-06 DIAGNOSIS — I10 Essential (primary) hypertension: Secondary | ICD-10-CM | POA: Diagnosis not present

## 2017-08-06 DIAGNOSIS — I4891 Unspecified atrial fibrillation: Secondary | ICD-10-CM | POA: Diagnosis not present

## 2017-08-20 DIAGNOSIS — H353122 Nonexudative age-related macular degeneration, left eye, intermediate dry stage: Secondary | ICD-10-CM | POA: Diagnosis not present

## 2017-08-20 DIAGNOSIS — H401132 Primary open-angle glaucoma, bilateral, moderate stage: Secondary | ICD-10-CM | POA: Diagnosis not present

## 2017-08-20 DIAGNOSIS — H0289 Other specified disorders of eyelid: Secondary | ICD-10-CM | POA: Diagnosis not present

## 2017-08-20 DIAGNOSIS — H353211 Exudative age-related macular degeneration, right eye, with active choroidal neovascularization: Secondary | ICD-10-CM | POA: Diagnosis not present

## 2017-09-02 DIAGNOSIS — I4891 Unspecified atrial fibrillation: Secondary | ICD-10-CM | POA: Diagnosis not present

## 2017-09-02 DIAGNOSIS — E7800 Pure hypercholesterolemia, unspecified: Secondary | ICD-10-CM | POA: Diagnosis not present

## 2017-09-02 DIAGNOSIS — I10 Essential (primary) hypertension: Secondary | ICD-10-CM | POA: Diagnosis not present

## 2017-09-07 DIAGNOSIS — Z789 Other specified health status: Secondary | ICD-10-CM | POA: Diagnosis not present

## 2017-09-07 DIAGNOSIS — E039 Hypothyroidism, unspecified: Secondary | ICD-10-CM | POA: Diagnosis not present

## 2017-09-07 DIAGNOSIS — F419 Anxiety disorder, unspecified: Secondary | ICD-10-CM | POA: Diagnosis not present

## 2017-09-07 DIAGNOSIS — I10 Essential (primary) hypertension: Secondary | ICD-10-CM | POA: Diagnosis not present

## 2017-09-07 DIAGNOSIS — Z681 Body mass index (BMI) 19 or less, adult: Secondary | ICD-10-CM | POA: Diagnosis not present

## 2017-09-07 DIAGNOSIS — I4892 Unspecified atrial flutter: Secondary | ICD-10-CM | POA: Diagnosis not present

## 2017-09-07 DIAGNOSIS — Z299 Encounter for prophylactic measures, unspecified: Secondary | ICD-10-CM | POA: Diagnosis not present

## 2017-10-06 DIAGNOSIS — E7800 Pure hypercholesterolemia, unspecified: Secondary | ICD-10-CM | POA: Diagnosis not present

## 2017-10-06 DIAGNOSIS — I4891 Unspecified atrial fibrillation: Secondary | ICD-10-CM | POA: Diagnosis not present

## 2017-10-06 DIAGNOSIS — I10 Essential (primary) hypertension: Secondary | ICD-10-CM | POA: Diagnosis not present

## 2017-10-07 DIAGNOSIS — H353211 Exudative age-related macular degeneration, right eye, with active choroidal neovascularization: Secondary | ICD-10-CM | POA: Diagnosis not present

## 2017-11-08 DIAGNOSIS — E7800 Pure hypercholesterolemia, unspecified: Secondary | ICD-10-CM | POA: Diagnosis not present

## 2017-11-08 DIAGNOSIS — I10 Essential (primary) hypertension: Secondary | ICD-10-CM | POA: Diagnosis not present

## 2017-11-08 DIAGNOSIS — I4891 Unspecified atrial fibrillation: Secondary | ICD-10-CM | POA: Diagnosis not present

## 2017-11-19 DIAGNOSIS — H353211 Exudative age-related macular degeneration, right eye, with active choroidal neovascularization: Secondary | ICD-10-CM | POA: Diagnosis not present

## 2017-11-19 DIAGNOSIS — H401132 Primary open-angle glaucoma, bilateral, moderate stage: Secondary | ICD-10-CM | POA: Diagnosis not present

## 2017-11-19 DIAGNOSIS — H04123 Dry eye syndrome of bilateral lacrimal glands: Secondary | ICD-10-CM | POA: Diagnosis not present

## 2017-11-19 DIAGNOSIS — H353122 Nonexudative age-related macular degeneration, left eye, intermediate dry stage: Secondary | ICD-10-CM | POA: Diagnosis not present

## 2017-12-06 DIAGNOSIS — I10 Essential (primary) hypertension: Secondary | ICD-10-CM | POA: Diagnosis not present

## 2017-12-06 DIAGNOSIS — R5383 Other fatigue: Secondary | ICD-10-CM | POA: Diagnosis not present

## 2017-12-06 DIAGNOSIS — Z1331 Encounter for screening for depression: Secondary | ICD-10-CM | POA: Diagnosis not present

## 2017-12-06 DIAGNOSIS — Z1339 Encounter for screening examination for other mental health and behavioral disorders: Secondary | ICD-10-CM | POA: Diagnosis not present

## 2017-12-06 DIAGNOSIS — E559 Vitamin D deficiency, unspecified: Secondary | ICD-10-CM | POA: Diagnosis not present

## 2017-12-06 DIAGNOSIS — E039 Hypothyroidism, unspecified: Secondary | ICD-10-CM | POA: Diagnosis not present

## 2017-12-06 DIAGNOSIS — Z7189 Other specified counseling: Secondary | ICD-10-CM | POA: Diagnosis not present

## 2017-12-06 DIAGNOSIS — Z299 Encounter for prophylactic measures, unspecified: Secondary | ICD-10-CM | POA: Diagnosis not present

## 2017-12-06 DIAGNOSIS — Z681 Body mass index (BMI) 19 or less, adult: Secondary | ICD-10-CM | POA: Diagnosis not present

## 2017-12-06 DIAGNOSIS — Z1211 Encounter for screening for malignant neoplasm of colon: Secondary | ICD-10-CM | POA: Diagnosis not present

## 2017-12-06 DIAGNOSIS — Z79899 Other long term (current) drug therapy: Secondary | ICD-10-CM | POA: Diagnosis not present

## 2017-12-06 DIAGNOSIS — I4892 Unspecified atrial flutter: Secondary | ICD-10-CM | POA: Diagnosis not present

## 2017-12-06 DIAGNOSIS — Z0000 Encounter for general adult medical examination without abnormal findings: Secondary | ICD-10-CM | POA: Diagnosis not present

## 2017-12-21 DIAGNOSIS — E2839 Other primary ovarian failure: Secondary | ICD-10-CM | POA: Diagnosis not present

## 2017-12-23 DIAGNOSIS — I10 Essential (primary) hypertension: Secondary | ICD-10-CM | POA: Diagnosis not present

## 2017-12-23 DIAGNOSIS — Z299 Encounter for prophylactic measures, unspecified: Secondary | ICD-10-CM | POA: Diagnosis not present

## 2017-12-23 DIAGNOSIS — E7800 Pure hypercholesterolemia, unspecified: Secondary | ICD-10-CM | POA: Diagnosis not present

## 2017-12-23 DIAGNOSIS — R21 Rash and other nonspecific skin eruption: Secondary | ICD-10-CM | POA: Diagnosis not present

## 2017-12-23 DIAGNOSIS — Z681 Body mass index (BMI) 19 or less, adult: Secondary | ICD-10-CM | POA: Diagnosis not present

## 2017-12-30 DIAGNOSIS — H35371 Puckering of macula, right eye: Secondary | ICD-10-CM | POA: Diagnosis not present

## 2017-12-30 DIAGNOSIS — H353211 Exudative age-related macular degeneration, right eye, with active choroidal neovascularization: Secondary | ICD-10-CM | POA: Diagnosis not present

## 2017-12-30 DIAGNOSIS — H353122 Nonexudative age-related macular degeneration, left eye, intermediate dry stage: Secondary | ICD-10-CM | POA: Diagnosis not present

## 2018-01-03 ENCOUNTER — Encounter: Payer: Self-pay | Admitting: Cardiology

## 2018-01-03 ENCOUNTER — Ambulatory Visit (INDEPENDENT_AMBULATORY_CARE_PROVIDER_SITE_OTHER): Payer: Medicare Other | Admitting: Cardiology

## 2018-01-03 ENCOUNTER — Encounter: Payer: Self-pay | Admitting: *Deleted

## 2018-01-03 VITALS — BP 120/64 | HR 68 | Ht 63.0 in | Wt 106.0 lb

## 2018-01-03 DIAGNOSIS — I480 Paroxysmal atrial fibrillation: Secondary | ICD-10-CM | POA: Diagnosis not present

## 2018-01-03 DIAGNOSIS — I10 Essential (primary) hypertension: Secondary | ICD-10-CM

## 2018-01-03 NOTE — Patient Instructions (Signed)

## 2018-01-03 NOTE — Progress Notes (Signed)
Cardiology Office Note  Date: 01/03/2018   ID: Erica [name], DOB [birth year], MRN [id number]  PCP: Kirstie Peri, MD  Primary Cardiologist: Nona Dell, MD   Chief Complaint  Patient presents with  . PAF    History of Present Illness: Erica [name] is an 84 y.o. female last seen in November 2018.  She is here today for a routine follow-up visit.  She does not report any palpitations or chest pain.  States that she had a recent physical with Dr. Sherryll Burger including lab work which we are requesting for review.  She has consistently declined anticoagulation.  She is on aspirin daily as well as low-dose amiodarone 100 mg daily.  I personally reviewed her ECG which shows sinus rhythm with left anterior fascicular block and increased voltage.  Past Medical History:  Diagnosis Date  . Atrial fibrillation (HCC)    Declines Coumadin  . Atrial flutter (HCC)    Status post RFA  . Interstitial lung disease (HCC)    Mild, DLCO of 68%    Past Surgical History:  Procedure Laterality Date  . LAPAROSCOPIC INGUINAL HERNIA REPAIR Right 01/15/2014    Current Outpatient Medications  Medication Sig Dispense Refill  . ALPRAZolam (XANAX) 0.25 MG tablet Take 0.25 mg by mouth 2 (two) times daily as needed.    Marland Kitchen amiodarone (PACERONE) 200 MG tablet Take 100 mg by mouth daily.     Marland Kitchen aspirin 325 MG tablet Take 1 tablet (325 mg total) by mouth daily. 90 tablet 3  . cholecalciferol (VITAMIN D) 1000 units tablet Take 1,000 Units by mouth daily.    Marland Kitchen levothyroxine (SYNTHROID, LEVOTHROID) 50 MCG tablet Take 50 mcg by mouth daily before breakfast.    . Multiple Vitamin (MULTIVITAMIN) tablet Take 1 tablet by mouth daily.    . Multiple Vitamins-Minerals (PRESERVISION AREDS PO) Take by mouth.    Marland Kitchen omeprazole (PRILOSEC) 20 MG capsule Take 20 mg by mouth 2 (two) times daily.     Bertram Gala Glycol-Propyl Glycol (SYSTANE OP) Apply 2 drops to eye daily.    . polyethylene glycol (MIRALAX / GLYCOLAX) packet  Take 17 g by mouth daily.     No current facility-administered medications for this visit.    Allergies:  Ciprofloxacin   Social History: The patient  reports that she has never smoked. She has never used smokeless tobacco. She reports that she does not drink alcohol or use drugs.   ROS:  Please see the history of present illness. Otherwise, complete review of systems is positive for anxiety.  All other systems are reviewed and negative.   Physical Exam: VS:  BP 120/64   Pulse 68   Ht  (1.6 m)   Wt 106 lb (48.1 kg)   SpO2 98%   BMI 18.78 kg/m , BMI Body mass index is 18.78 kg/m.  Wt Readings from Last 3 Encounters:  01/03/18 106 lb (48.1 kg)  07/02/17 105 lb 6.4 oz (47.8 kg)  12/30/16 107 lb (48.5 kg)    General: Frail-appearing elderly woman, appears comfortable at rest. HEENT: Conjunctiva and lids normal, oropharynx clear. Neck: Supple, no elevated JVP or carotid bruits, no thyromegaly. Lungs: Clear to auscultation, nonlabored breathing at rest. Cardiac: Regular rate and rhythm, no S3, soft systolic murmur. Abdomen: Soft, nontender, bowel sounds present. Extremities: No pitting edema, distal pulses 2+.  ECG: I personally reviewed the tracing from 12/30/2016 which showed sinus rhythm with LVH.  Recent Labwork:  January 2018: TSH 5.71  Assessment  and Plan:  1.  Paroxysmal atrial fibrillation as well as previous history of atrial flutter ablation.  She is doing well without palpitations and is in sinus rhythm today by ECG.  She declines anticoagulation and continues on aspirin as well as low-dose amiodarone.  We are requesting her interval lab work from Dr. Sherryll Burger.  2.  Essential hypertension, blood pressure is normal today.  She was previously on lisinopril, now on no antihypertensives.  Watching sodium in her diet.  Current medicines were reviewed with the patient today.   Orders Placed This Encounter  Procedures  . EKG 12-Lead    Disposition: Follow-up in 6  months.  Signed, Jonelle Sidle, MD, [facility] 01/03/2018 2:28 PM    [facility] at [facility] [address] [place], [place], [place] [address] Phone: [phone]; Fax: [phone]

## 2018-01-06 ENCOUNTER — Telehealth: Payer: Self-pay

## 2018-01-06 NOTE — Telephone Encounter (Signed)
Patient notified. Routed to PCP 

## 2018-01-06 NOTE — Telephone Encounter (Signed)
-----   Message from Eustace Moore, LPN sent at 4/0/9811  9:29 AM EDT -----   ----- Message ----- From: Jonelle Sidle, MD Sent: 01/06/2018   9:08 AM To: Eustace Moore, LPN  Results reviewed.  LFTs and TSH normal on low-dose amiodarone. A copy of this test should be forwarded to Kirstie Peri, MD.

## 2018-01-10 DIAGNOSIS — E7800 Pure hypercholesterolemia, unspecified: Secondary | ICD-10-CM | POA: Diagnosis not present

## 2018-01-10 DIAGNOSIS — I10 Essential (primary) hypertension: Secondary | ICD-10-CM | POA: Diagnosis not present

## 2018-01-10 DIAGNOSIS — I4891 Unspecified atrial fibrillation: Secondary | ICD-10-CM | POA: Diagnosis not present

## 2018-03-01 DIAGNOSIS — I10 Essential (primary) hypertension: Secondary | ICD-10-CM | POA: Diagnosis not present

## 2018-03-01 DIAGNOSIS — E7800 Pure hypercholesterolemia, unspecified: Secondary | ICD-10-CM | POA: Diagnosis not present

## 2018-03-01 DIAGNOSIS — I4891 Unspecified atrial fibrillation: Secondary | ICD-10-CM | POA: Diagnosis not present

## 2018-03-07 DIAGNOSIS — Z299 Encounter for prophylactic measures, unspecified: Secondary | ICD-10-CM | POA: Diagnosis not present

## 2018-03-07 DIAGNOSIS — I4892 Unspecified atrial flutter: Secondary | ICD-10-CM | POA: Diagnosis not present

## 2018-03-07 DIAGNOSIS — Z681 Body mass index (BMI) 19 or less, adult: Secondary | ICD-10-CM | POA: Diagnosis not present

## 2018-03-07 DIAGNOSIS — F419 Anxiety disorder, unspecified: Secondary | ICD-10-CM | POA: Diagnosis not present

## 2018-03-07 DIAGNOSIS — I10 Essential (primary) hypertension: Secondary | ICD-10-CM | POA: Diagnosis not present

## 2018-03-07 DIAGNOSIS — E039 Hypothyroidism, unspecified: Secondary | ICD-10-CM | POA: Diagnosis not present

## 2018-04-01 DIAGNOSIS — I10 Essential (primary) hypertension: Secondary | ICD-10-CM | POA: Diagnosis not present

## 2018-04-01 DIAGNOSIS — E7800 Pure hypercholesterolemia, unspecified: Secondary | ICD-10-CM | POA: Diagnosis not present

## 2018-04-01 DIAGNOSIS — I4891 Unspecified atrial fibrillation: Secondary | ICD-10-CM | POA: Diagnosis not present

## 2018-04-07 DIAGNOSIS — H353211 Exudative age-related macular degeneration, right eye, with active choroidal neovascularization: Secondary | ICD-10-CM | POA: Diagnosis not present

## 2018-04-12 DIAGNOSIS — H04123 Dry eye syndrome of bilateral lacrimal glands: Secondary | ICD-10-CM | POA: Diagnosis not present

## 2018-04-12 DIAGNOSIS — H0289 Other specified disorders of eyelid: Secondary | ICD-10-CM | POA: Diagnosis not present

## 2018-04-12 DIAGNOSIS — H401111 Primary open-angle glaucoma, right eye, mild stage: Secondary | ICD-10-CM | POA: Diagnosis not present

## 2018-04-12 DIAGNOSIS — H401122 Primary open-angle glaucoma, left eye, moderate stage: Secondary | ICD-10-CM | POA: Diagnosis not present

## 2018-05-03 DIAGNOSIS — E7800 Pure hypercholesterolemia, unspecified: Secondary | ICD-10-CM | POA: Diagnosis not present

## 2018-05-03 DIAGNOSIS — I10 Essential (primary) hypertension: Secondary | ICD-10-CM | POA: Diagnosis not present

## 2018-05-03 DIAGNOSIS — I4891 Unspecified atrial fibrillation: Secondary | ICD-10-CM | POA: Diagnosis not present

## 2018-06-03 DIAGNOSIS — I10 Essential (primary) hypertension: Secondary | ICD-10-CM | POA: Diagnosis not present

## 2018-06-03 DIAGNOSIS — I4891 Unspecified atrial fibrillation: Secondary | ICD-10-CM | POA: Diagnosis not present

## 2018-06-03 DIAGNOSIS — E7800 Pure hypercholesterolemia, unspecified: Secondary | ICD-10-CM | POA: Diagnosis not present

## 2018-06-13 DIAGNOSIS — I4892 Unspecified atrial flutter: Secondary | ICD-10-CM | POA: Diagnosis not present

## 2018-06-13 DIAGNOSIS — I10 Essential (primary) hypertension: Secondary | ICD-10-CM | POA: Diagnosis not present

## 2018-06-13 DIAGNOSIS — R350 Frequency of micturition: Secondary | ICD-10-CM | POA: Diagnosis not present

## 2018-06-13 DIAGNOSIS — F419 Anxiety disorder, unspecified: Secondary | ICD-10-CM | POA: Diagnosis not present

## 2018-06-13 DIAGNOSIS — Z681 Body mass index (BMI) 19 or less, adult: Secondary | ICD-10-CM | POA: Diagnosis not present

## 2018-06-13 DIAGNOSIS — Z23 Encounter for immunization: Secondary | ICD-10-CM | POA: Diagnosis not present

## 2018-06-13 DIAGNOSIS — Z299 Encounter for prophylactic measures, unspecified: Secondary | ICD-10-CM | POA: Diagnosis not present

## 2018-07-06 DIAGNOSIS — E7800 Pure hypercholesterolemia, unspecified: Secondary | ICD-10-CM | POA: Diagnosis not present

## 2018-07-06 DIAGNOSIS — I4891 Unspecified atrial fibrillation: Secondary | ICD-10-CM | POA: Diagnosis not present

## 2018-07-06 DIAGNOSIS — I10 Essential (primary) hypertension: Secondary | ICD-10-CM | POA: Diagnosis not present

## 2018-07-07 DIAGNOSIS — H353211 Exudative age-related macular degeneration, right eye, with active choroidal neovascularization: Secondary | ICD-10-CM | POA: Diagnosis not present

## 2018-07-15 ENCOUNTER — Ambulatory Visit: Payer: Medicare Other | Admitting: Cardiology

## 2018-08-12 DIAGNOSIS — E7800 Pure hypercholesterolemia, unspecified: Secondary | ICD-10-CM | POA: Diagnosis not present

## 2018-08-12 DIAGNOSIS — I4891 Unspecified atrial fibrillation: Secondary | ICD-10-CM | POA: Diagnosis not present

## 2018-08-12 DIAGNOSIS — I10 Essential (primary) hypertension: Secondary | ICD-10-CM | POA: Diagnosis not present

## 2018-08-15 DIAGNOSIS — H26493 Other secondary cataract, bilateral: Secondary | ICD-10-CM | POA: Diagnosis not present

## 2018-08-15 DIAGNOSIS — H401132 Primary open-angle glaucoma, bilateral, moderate stage: Secondary | ICD-10-CM | POA: Diagnosis not present

## 2018-08-15 DIAGNOSIS — H353211 Exudative age-related macular degeneration, right eye, with active choroidal neovascularization: Secondary | ICD-10-CM | POA: Diagnosis not present

## 2018-08-15 DIAGNOSIS — H353122 Nonexudative age-related macular degeneration, left eye, intermediate dry stage: Secondary | ICD-10-CM | POA: Diagnosis not present

## 2018-08-16 NOTE — Progress Notes (Signed)
Cardiology Office Note  Date: 08/17/2018   ID: Erica [name], DOB [birth year], MRN [id number]  PCP: Kirstie PeriShah, Ashish, MD  Primary Cardiologist: Nona DellSamuel Ryenn Howeth, MD   Chief Complaint  Patient presents with  . Atrial Fibrillation     History of Present Illness: Erica [name] is an 84 y.o. female last seen in May.  She presents for a routine follow-up visit with her daughter.  She has had no significant change in overall status, still does her ADLs and shopping.  She does not report any palpitations or chest pain.  She has consistently declined anticoagulation.  She is on aspirin daily as well as low-dose amiodarone 100 mg daily.  I reviewed her lab work from earlier this year per Dr. Sherryll BurgerShah.  Past Medical History:  Diagnosis Date  . Atrial fibrillation (HCC)    Declines Coumadin  . Atrial flutter (HCC)    Status post RFA  . Interstitial lung disease (HCC)    Mild, DLCO of 68%    Past Surgical History:  Procedure Laterality Date  . LAPAROSCOPIC INGUINAL HERNIA REPAIR Right 01/15/2014    Current Outpatient Medications  Medication Sig Dispense Refill  . ALPRAZolam (XANAX) 0.25 MG tablet Take 0.125 mg by mouth 2 (two) times daily as needed.     Marland Kitchen. amiodarone (PACERONE) 200 MG tablet Take 100 mg by mouth daily.     Marland Kitchen. aspirin 325 MG tablet Take 1 tablet (325 mg total) by mouth daily. 90 tablet 3  . cholecalciferol (VITAMIN D) 1000 units tablet Take 1,000 Units by mouth daily.    Marland Kitchen. levothyroxine (SYNTHROID, LEVOTHROID) 50 MCG tablet Take 50 mcg by mouth daily before breakfast.    . Multiple Vitamin (MULTIVITAMIN) tablet Take 1 tablet by mouth daily.    . Multiple Vitamins-Minerals (PRESERVISION AREDS PO) Take by mouth.    Marland Kitchen. omeprazole (PRILOSEC) 20 MG capsule Take 20 mg by mouth 2 (two) times daily.     Bertram Gala. Polyethyl Glycol-Propyl Glycol (SYSTANE OP) Apply 2 drops to eye daily.    . polyethylene glycol (MIRALAX / GLYCOLAX) packet Take 17 g by mouth daily.     No current  facility-administered medications for this visit.    Allergies:  Ciprofloxacin   Social History: The patient  reports that she has never smoked. She has never used smokeless tobacco. She reports that she does not drink alcohol or use drugs.   ROS:  Please see the history of present illness. Otherwise, complete review of systems is positive for hearing loss.  All other systems are reviewed and negative.   Physical Exam: VS:  BP 103/65   Pulse 61   Ht 5\' 3"  (1.6 m)   Wt 102 lb 12.8 oz (46.6 kg)   SpO2 99%   BMI 18.21 kg/m , BMI Body mass index is 18.21 kg/m.  Wt Readings from Last 3 Encounters:  08/17/18 102 lb 12.8 oz (46.6 kg)  01/03/18 106 lb (48.1 kg)  07/02/17 105 lb 6.4 oz (47.8 kg)    General: Frail appearing elderly woman, appears comfortable at rest. HEENT: Conjunctiva and lids normal, oropharynx clear. Neck: Supple, no elevated JVP or carotid bruits, no thyromegaly. Lungs: Clear to auscultation, nonlabored breathing at rest. Cardiac: Regular rate and rhythm, no S3 or significant systolic murmur. Abdomen: Soft, nontender, bowel sounds present. Extremities: No pitting edema, distal pulses 2+.  ECG: I personally reviewed the tracing from 01/05/2018 which showed sinus rhythm with left anterior fascicular block and increased voltage.  Recent Labwork:  April 2019: Hemoglobin 13.2, platelets 236, BUN 19, creatinine 0.76, potassium 4.5, AST 22, ALT 10, TSH 1.19, cholesterol 175, triglycerides 76, HDL 70, LDL 90, free T4 1.43  Assessment and Plan:  1.  Paroxysmal atrial fibrillation and previous history of atrial flutter ablation.  She declines anticoagulation as before.  Continues on aspirin and low-dose amiodarone.  TSH and LFTs were normal earlier this year.  2.  Essential hypertension, blood pressure is very well controlled today.  No changes in current regimen.  Keep follow-up with Dr. Sherryll Burger.  Current medicines were reviewed with the patient today.  Disposition:  Follow-up in 6 months.  Signed, Jonelle Sidle, MD, [facility] 08/17/2018 1:45 PM    [facility] at [facility] [address] [place], [place], [place] [address] Phone: [phone]; Fax: [phone]

## 2018-08-17 ENCOUNTER — Ambulatory Visit (INDEPENDENT_AMBULATORY_CARE_PROVIDER_SITE_OTHER): Payer: Medicare Other | Admitting: Cardiology

## 2018-08-17 ENCOUNTER — Encounter: Payer: Self-pay | Admitting: Cardiology

## 2018-08-17 VITALS — BP 103/65 | HR 61 | Ht 63.0 in | Wt 102.8 lb

## 2018-08-17 DIAGNOSIS — I10 Essential (primary) hypertension: Secondary | ICD-10-CM

## 2018-08-17 DIAGNOSIS — I480 Paroxysmal atrial fibrillation: Secondary | ICD-10-CM | POA: Diagnosis not present

## 2018-08-17 NOTE — Patient Instructions (Addendum)

## 2018-08-26 ENCOUNTER — Telehealth: Payer: Self-pay | Admitting: Cardiology

## 2018-08-26 NOTE — Telephone Encounter (Signed)
Called pt. Stated that she spoke with Isabelle CourseLydia previously and was told to stop by office to get help with cheaper medications. I will forward to [place] as I am not aware of the assistance programs she was going to help pt with. Pt is not out of medication, but cannot afford it.

## 2018-08-26 NOTE — Telephone Encounter (Signed)
Patient walked in about price of her medication amiodarone (PACERONE) 100 MG tablet Dr Sherryll BurgerShah refilled for her 06/03/18  90 pills  [employer] charged her 99.55 for 3 month supply [facility] was going to charge $160 for 90 day supply. She said that she was told by Dr McDowell's nurse to come talk to her about trying to find cheaper medication

## 2018-09-01 MED ORDER — AMIODARONE HCL 200 MG PO TABS: 100.0000 mg | ORAL_TABLET | Freq: Every day | ORAL | 6 refills | Status: DC

## 2018-09-01 NOTE — Telephone Encounter (Signed)
Patient informed that [name] has amiodarone 200 mg #15 for $10/mth supply. Patient says she will contact [name] for refill when she is due again. Verbalized understanding.

## 2018-09-09 DIAGNOSIS — E7800 Pure hypercholesterolemia, unspecified: Secondary | ICD-10-CM | POA: Diagnosis not present

## 2018-09-09 DIAGNOSIS — I4891 Unspecified atrial fibrillation: Secondary | ICD-10-CM | POA: Diagnosis not present

## 2018-09-09 DIAGNOSIS — I10 Essential (primary) hypertension: Secondary | ICD-10-CM | POA: Diagnosis not present

## 2018-09-14 DIAGNOSIS — R350 Frequency of micturition: Secondary | ICD-10-CM | POA: Diagnosis not present

## 2018-09-14 DIAGNOSIS — I4892 Unspecified atrial flutter: Secondary | ICD-10-CM | POA: Diagnosis not present

## 2018-09-14 DIAGNOSIS — Z681 Body mass index (BMI) 19 or less, adult: Secondary | ICD-10-CM | POA: Diagnosis not present

## 2018-09-14 DIAGNOSIS — Z299 Encounter for prophylactic measures, unspecified: Secondary | ICD-10-CM | POA: Diagnosis not present

## 2018-09-14 DIAGNOSIS — F419 Anxiety disorder, unspecified: Secondary | ICD-10-CM | POA: Diagnosis not present

## 2018-09-14 DIAGNOSIS — I10 Essential (primary) hypertension: Secondary | ICD-10-CM | POA: Diagnosis not present

## 2018-09-14 DIAGNOSIS — Z789 Other specified health status: Secondary | ICD-10-CM | POA: Diagnosis not present

## 2018-09-14 DIAGNOSIS — N390 Urinary tract infection, site not specified: Secondary | ICD-10-CM | POA: Diagnosis not present

## 2018-09-20 DIAGNOSIS — I10 Essential (primary) hypertension: Secondary | ICD-10-CM | POA: Diagnosis not present

## 2018-09-20 DIAGNOSIS — F419 Anxiety disorder, unspecified: Secondary | ICD-10-CM | POA: Diagnosis not present

## 2018-09-20 DIAGNOSIS — E039 Hypothyroidism, unspecified: Secondary | ICD-10-CM | POA: Diagnosis not present

## 2018-09-20 DIAGNOSIS — Z681 Body mass index (BMI) 19 or less, adult: Secondary | ICD-10-CM | POA: Diagnosis not present

## 2018-09-20 DIAGNOSIS — E7800 Pure hypercholesterolemia, unspecified: Secondary | ICD-10-CM | POA: Diagnosis not present

## 2018-09-20 DIAGNOSIS — Z299 Encounter for prophylactic measures, unspecified: Secondary | ICD-10-CM | POA: Diagnosis not present

## 2018-09-20 DIAGNOSIS — J329 Chronic sinusitis, unspecified: Secondary | ICD-10-CM | POA: Diagnosis not present

## 2018-09-27 DIAGNOSIS — H353211 Exudative age-related macular degeneration, right eye, with active choroidal neovascularization: Secondary | ICD-10-CM | POA: Diagnosis not present

## 2018-09-27 DIAGNOSIS — H35373 Puckering of macula, bilateral: Secondary | ICD-10-CM | POA: Diagnosis not present

## 2018-09-27 DIAGNOSIS — H353122 Nonexudative age-related macular degeneration, left eye, intermediate dry stage: Secondary | ICD-10-CM | POA: Diagnosis not present

## 2018-09-27 DIAGNOSIS — Z961 Presence of intraocular lens: Secondary | ICD-10-CM | POA: Diagnosis not present

## 2018-10-07 DIAGNOSIS — I10 Essential (primary) hypertension: Secondary | ICD-10-CM | POA: Diagnosis not present

## 2018-10-07 DIAGNOSIS — E7800 Pure hypercholesterolemia, unspecified: Secondary | ICD-10-CM | POA: Diagnosis not present

## 2018-10-07 DIAGNOSIS — I4891 Unspecified atrial fibrillation: Secondary | ICD-10-CM | POA: Diagnosis not present

## 2018-12-13 DIAGNOSIS — I4891 Unspecified atrial fibrillation: Secondary | ICD-10-CM | POA: Diagnosis not present

## 2018-12-13 DIAGNOSIS — I10 Essential (primary) hypertension: Secondary | ICD-10-CM | POA: Diagnosis not present

## 2018-12-13 DIAGNOSIS — E7800 Pure hypercholesterolemia, unspecified: Secondary | ICD-10-CM | POA: Diagnosis not present

## 2018-12-22 DIAGNOSIS — I10 Essential (primary) hypertension: Secondary | ICD-10-CM | POA: Diagnosis not present

## 2018-12-22 DIAGNOSIS — I4892 Unspecified atrial flutter: Secondary | ICD-10-CM | POA: Diagnosis not present

## 2018-12-22 DIAGNOSIS — Z681 Body mass index (BMI) 19 or less, adult: Secondary | ICD-10-CM | POA: Diagnosis not present

## 2018-12-22 DIAGNOSIS — Z299 Encounter for prophylactic measures, unspecified: Secondary | ICD-10-CM | POA: Diagnosis not present

## 2018-12-22 DIAGNOSIS — E039 Hypothyroidism, unspecified: Secondary | ICD-10-CM | POA: Diagnosis not present

## 2019-01-18 DIAGNOSIS — I10 Essential (primary) hypertension: Secondary | ICD-10-CM | POA: Diagnosis not present

## 2019-01-18 DIAGNOSIS — E7800 Pure hypercholesterolemia, unspecified: Secondary | ICD-10-CM | POA: Diagnosis not present

## 2019-01-18 DIAGNOSIS — I4891 Unspecified atrial fibrillation: Secondary | ICD-10-CM | POA: Diagnosis not present

## 2019-01-25 ENCOUNTER — Encounter: Payer: Self-pay | Admitting: Cardiology

## 2019-01-25 DIAGNOSIS — Z1331 Encounter for screening for depression: Secondary | ICD-10-CM | POA: Diagnosis not present

## 2019-01-25 DIAGNOSIS — Z0000 Encounter for general adult medical examination without abnormal findings: Secondary | ICD-10-CM | POA: Diagnosis not present

## 2019-01-25 DIAGNOSIS — E7800 Pure hypercholesterolemia, unspecified: Secondary | ICD-10-CM | POA: Diagnosis not present

## 2019-01-25 DIAGNOSIS — Z299 Encounter for prophylactic measures, unspecified: Secondary | ICD-10-CM | POA: Diagnosis not present

## 2019-01-25 DIAGNOSIS — R5383 Other fatigue: Secondary | ICD-10-CM | POA: Diagnosis not present

## 2019-01-25 DIAGNOSIS — E039 Hypothyroidism, unspecified: Secondary | ICD-10-CM | POA: Diagnosis not present

## 2019-01-25 DIAGNOSIS — Z1211 Encounter for screening for malignant neoplasm of colon: Secondary | ICD-10-CM | POA: Diagnosis not present

## 2019-01-25 DIAGNOSIS — Z7189 Other specified counseling: Secondary | ICD-10-CM | POA: Diagnosis not present

## 2019-01-25 DIAGNOSIS — Z1339 Encounter for screening examination for other mental health and behavioral disorders: Secondary | ICD-10-CM | POA: Diagnosis not present

## 2019-01-25 DIAGNOSIS — Z681 Body mass index (BMI) 19 or less, adult: Secondary | ICD-10-CM | POA: Diagnosis not present

## 2019-01-25 DIAGNOSIS — Z79899 Other long term (current) drug therapy: Secondary | ICD-10-CM | POA: Diagnosis not present

## 2019-02-15 DIAGNOSIS — E7800 Pure hypercholesterolemia, unspecified: Secondary | ICD-10-CM | POA: Diagnosis not present

## 2019-02-15 DIAGNOSIS — I4891 Unspecified atrial fibrillation: Secondary | ICD-10-CM | POA: Diagnosis not present

## 2019-02-15 DIAGNOSIS — I10 Essential (primary) hypertension: Secondary | ICD-10-CM | POA: Diagnosis not present

## 2019-02-16 DIAGNOSIS — H353211 Exudative age-related macular degeneration, right eye, with active choroidal neovascularization: Secondary | ICD-10-CM | POA: Diagnosis not present

## 2019-02-16 DIAGNOSIS — H35373 Puckering of macula, bilateral: Secondary | ICD-10-CM | POA: Diagnosis not present

## 2019-02-16 DIAGNOSIS — H353122 Nonexudative age-related macular degeneration, left eye, intermediate dry stage: Secondary | ICD-10-CM | POA: Diagnosis not present

## 2019-02-21 DIAGNOSIS — H401132 Primary open-angle glaucoma, bilateral, moderate stage: Secondary | ICD-10-CM | POA: Diagnosis not present

## 2019-03-10 DIAGNOSIS — I10 Essential (primary) hypertension: Secondary | ICD-10-CM | POA: Diagnosis not present

## 2019-03-10 DIAGNOSIS — Z299 Encounter for prophylactic measures, unspecified: Secondary | ICD-10-CM | POA: Diagnosis not present

## 2019-03-10 DIAGNOSIS — F419 Anxiety disorder, unspecified: Secondary | ICD-10-CM | POA: Diagnosis not present

## 2019-03-10 DIAGNOSIS — J849 Interstitial pulmonary disease, unspecified: Secondary | ICD-10-CM | POA: Diagnosis not present

## 2019-03-10 DIAGNOSIS — I4892 Unspecified atrial flutter: Secondary | ICD-10-CM | POA: Diagnosis not present

## 2019-03-10 DIAGNOSIS — Z681 Body mass index (BMI) 19 or less, adult: Secondary | ICD-10-CM | POA: Diagnosis not present

## 2019-03-24 DIAGNOSIS — E7800 Pure hypercholesterolemia, unspecified: Secondary | ICD-10-CM | POA: Diagnosis not present

## 2019-03-24 DIAGNOSIS — I4891 Unspecified atrial fibrillation: Secondary | ICD-10-CM | POA: Diagnosis not present

## 2019-03-24 DIAGNOSIS — I10 Essential (primary) hypertension: Secondary | ICD-10-CM | POA: Diagnosis not present

## 2019-04-19 ENCOUNTER — Telehealth: Payer: Self-pay | Admitting: Cardiology

## 2019-04-19 NOTE — Telephone Encounter (Signed)
Virtual Visit Pre-Appointment Phone Call  "(Name), I am calling you today to discuss your upcoming appointment. We are currently trying to limit exposure to the virus that causes COVID-19 by seeing patients at home rather than in the office."  1. "What is the BEST phone number to call the day of the visit?" - include this in appointment notes  2. Do you have or have access to (through a family member/friend) a smartphone with video capability that we can use for your visit?" a. If yes - list this number in appt notes as cell (if different from BEST phone #) and list the appointment type as a VIDEO visit in appointment notes b. If no - list the appointment type as a PHONE visit in appointment notes  3. Confirm consent - "In the setting of the current Covid19 crisis, you are scheduled for a (phone or video) visit with your provider on (date) at (time).  Just as we do with many in-office visits, in order for you to participate in this visit, we must obtain consent.  If you'd like, I can send this to your mychart (if signed up) or email for you to review.  Otherwise, I can obtain your verbal consent now.  All virtual visits are billed to your insurance company just like a normal visit would be.  By agreeing to a virtual visit, we'd like you to understand that the technology does not allow for your provider to perform an examination, and thus may limit your provider's ability to fully assess your condition. If your provider identifies any concerns that need to be evaluated in person, we will make arrangements to do so.  Finally, though the technology is pretty good, we cannot assure that it will always work on either your or our end, and in the setting of a video visit, we may have to convert it to a phone-only visit.  In either situation, we cannot ensure that we have a secure connection.  Are you willing to proceed?" STAFF: Did the patient verbally acknowledge consent to telehealth visit? Document  YES/NO here: yes  4. Advise patient to be prepared - "Two hours prior to your appointment, go ahead and check your blood pressure, pulse, oxygen saturation, and your weight (if you have the equipment to check those) and write them all down. When your visit starts, your provider will ask you for this information. If you have an Apple Watch or Kardia device, please plan to have heart rate information ready on the day of your appointment. Please have a pen and paper handy nearby the day of the visit as well."  5. Give patient instructions for MyChart download to smartphone OR Doximity/[email or web address] as below if video visit (depending on what platform provider is using)  6. Inform patient they will receive a phone call 15 minutes prior to their appointment time (may be from unknown caller ID) so they should be prepared to answer    TELEPHONE CALL NOTE  Erica [name] has been deemed a candidate for a follow-up tele-health visit to limit community exposure during the Covid-19 pandemic. I spoke with the patient via phone to ensure availability of phone/video source, confirm preferred email & phone number, and discuss instructions and expectations.  I reminded SUNDEEP CARY to be prepared with any vital sign and/or heart rhythm information that could potentially be obtained via home monitoring, at the time of her visit. I reminded KAMELIA LAMPKINS to expect a phone call prior to  her visit.  Geraldine ContrasStephanie R Smith 04/19/2019 2:14 PM   INSTRUCTIONS FOR DOWNLOADING THE MYCHART APP TO SMARTPHONE  - The patient must first make sure to have activated MyChart and know their login information - If Apple, go to [employer] Store and type in MyChart in the search bar and download the app. If Android, ask patient to go to [employer] Play Store and type in PlessisMyChart in the search bar and download the app. The app is free but as with any other app downloads, their phone may require them to verify saved payment information or  Apple/Android password.  - The patient will need to then log into the app with their MyChart username and password, and select [facility] as their healthcare provider to link the account. When it is time for your visit, go to the MyChart app, find appointments, and click Begin Video Visit. Be sure to Select Allow for your device to access the Microphone and Camera for your visit. You will then be connected, and your provider will be with you shortly.  **If they have any issues connecting, or need assistance please contact MyChart service desk [phone])**  **If using a computer, in order to ensure the best quality for their visit they will need to use either of the following Internet Browsers: [employer] Edge, or Google Chrome**  IF USING DOXIMITY or DOXY.ME - The patient will receive a link just prior to their visit by text.     FULL LENGTH CONSENT FOR TELE-HEALTH VISIT   I hereby voluntarily request, consent and authorize [facility] and its employed or contracted physicians, physician assistants, nurse practitioners or other licensed health care professionals (the Practitioner), to provide me with telemedicine health care services (the Services") as deemed necessary by the treating Practitioner. I acknowledge and consent to receive the Services by the Practitioner via telemedicine. I understand that the telemedicine visit will involve communicating with the Practitioner through live audiovisual communication technology and the disclosure of certain medical information by electronic transmission. I acknowledge that I have been given the opportunity to request an in-person assessment or other available alternative prior to the telemedicine visit and am voluntarily participating in the telemedicine visit.  I understand that I have the right to withhold or withdraw my consent to the use of telemedicine in the course of my care at any time, without affecting my right to future care  or treatment, and that the Practitioner or I may terminate the telemedicine visit at any time. I understand that I have the right to inspect all information obtained and/or recorded in the course of the telemedicine visit and may receive copies of available information for a reasonable fee.  I understand that some of the potential risks of receiving the Services via telemedicine include:   Delay or interruption in medical evaluation due to technological equipment failure or disruption;  Information transmitted may not be sufficient (e.g. poor resolution of images) to allow for appropriate medical decision making by the Practitioner; and/or   In rare instances, security protocols could fail, causing a breach of personal health information.  Furthermore, I acknowledge that it is my responsibility to provide information about my medical history, conditions and care that is complete and accurate to the best of my ability. I acknowledge that Practitioner's advice, recommendations, and/or decision may be based on factors not within their control, such as incomplete or inaccurate data provided by me or distortions of diagnostic images or specimens that may result from electronic transmissions. I  understand that the practice of medicine is not an exact science and that Practitioner makes no warranties or guarantees regarding treatment outcomes. I acknowledge that I will receive a copy of this consent concurrently upon execution via email to the email address I last provided but may also request a printed copy by calling the office of [place].    I understand that my insurance will be billed for this visit.   I have read or had this consent read to me.  I understand the contents of this consent, which adequately explains the benefits and risks of the Services being provided via telemedicine.   I have been provided ample opportunity to ask questions regarding this consent and the Services and have had  my questions answered to my satisfaction.  I give my informed consent for the services to be provided through the use of telemedicine in my medical care  By participating in this telemedicine visit I agree to the above.

## 2019-04-21 DIAGNOSIS — I4891 Unspecified atrial fibrillation: Secondary | ICD-10-CM | POA: Diagnosis not present

## 2019-04-21 DIAGNOSIS — I10 Essential (primary) hypertension: Secondary | ICD-10-CM | POA: Diagnosis not present

## 2019-04-21 DIAGNOSIS — E7800 Pure hypercholesterolemia, unspecified: Secondary | ICD-10-CM | POA: Diagnosis not present

## 2019-04-28 ENCOUNTER — Ambulatory Visit (INDEPENDENT_AMBULATORY_CARE_PROVIDER_SITE_OTHER): Payer: Medicare Other | Admitting: Cardiology

## 2019-04-28 ENCOUNTER — Other Ambulatory Visit: Payer: Self-pay

## 2019-04-28 ENCOUNTER — Encounter: Payer: Self-pay | Admitting: Cardiology

## 2019-04-28 ENCOUNTER — Encounter: Payer: Self-pay | Admitting: *Deleted

## 2019-04-28 VITALS — BP 153/76 | HR 68 | Temp 98.0°F | Ht 63.0 in | Wt 109.4 lb

## 2019-04-28 DIAGNOSIS — I10 Essential (primary) hypertension: Secondary | ICD-10-CM | POA: Diagnosis not present

## 2019-04-28 DIAGNOSIS — I480 Paroxysmal atrial fibrillation: Secondary | ICD-10-CM | POA: Diagnosis not present

## 2019-04-28 NOTE — Patient Instructions (Addendum)

## 2019-04-28 NOTE — Progress Notes (Signed)
Cardiology Office Note  Date: 04/28/2019   ID: Erica KillingsBeatrice T [name], DOB April 09, 1928, MRN [id number]  PCP:  Kirstie PeriShah, Ashish, MD  Cardiologist:  Nona DellSamuel , MD Electrophysiologist:  None   Chief Complaint  Patient presents with  . Cardiac follow-up    History of Present Illness: Erica [name] is an 85 y.o. female last seen in December 2019.  She requested to be seen in the office today rather than a virtual encounter.  She is here today with her daughter.  She states that she has been anxious lately, no specific sense of progressive palpitations or chest pain however.  She reports compliance with her medications.  She has consistently declined anticoagulation.  She does take full dose aspirin along with amiodarone which has been fairly effective in suppressing arrhythmia.  I personally reviewed her ECG today which shows normal sinus rhythm with increased voltage, normal QTC.  We discussed follow-up lab work.  She states that she had this obtained with Dr. Sherryll BurgerShah in the last few months, results to be requested.  Past Medical History:  Diagnosis Date  . Atrial fibrillation (HCC)    Declines Coumadin  . Atrial flutter (HCC)    Status post RFA  . Interstitial lung disease (HCC)    Mild, DLCO of 68%    Past Surgical History:  Procedure Laterality Date  . LAPAROSCOPIC INGUINAL HERNIA REPAIR Right 01/15/2014    Current Outpatient Medications  Medication Sig Dispense Refill  . ALPRAZolam (XANAX) 0.25 MG tablet Take 0.125 mg by mouth 2 (two) times daily as needed.     Marland Kitchen. amiodarone (PACERONE) 200 MG tablet Take 0.5 tablets (100 mg total) by mouth daily. 15 tablet 6  . aspirin 325 MG tablet Take 1 tablet (325 mg total) by mouth daily. 90 tablet 3  . brimonidine (ALPHAGAN) 0.2 % ophthalmic solution Place 1 drop into both eyes daily.    . Calcium Carbonate-Vitamin D (CALTRATE 600+D PO) Take 1 tablet by mouth 2 (two) times daily.    Marland Kitchen. levothyroxine (SYNTHROID, LEVOTHROID) 50 MCG  tablet Take 50 mcg by mouth daily before breakfast.    . Multiple Vitamin (MULTIVITAMIN) tablet Take 1 tablet by mouth daily.    . Multiple Vitamins-Minerals (PRESERVISION AREDS PO) Take by mouth.    Marland Kitchen. omeprazole (PRILOSEC) 20 MG capsule Take 20 mg by mouth 2 (two) times daily.     Bertram Gala. Polyethyl Glycol-Propyl Glycol (SYSTANE OP) Apply 2 drops to eye daily.    . polyethylene glycol (MIRALAX / GLYCOLAX) packet Take 17 g by mouth as needed.      No current facility-administered medications for this visit.    Allergies:  Ciprofloxacin   Social History: The patient  reports that she has never smoked. She has never used smokeless tobacco. She reports that she does not drink alcohol or use drugs.   Family History: The patient's family history includes Diabetes in her brother, brother, and brother; Heart disease in her brother, brother, brother, father, and mother.   ROS:  Please see the history of present illness. Otherwise, complete review of systems is positive for none.  All other systems are reviewed and negative.   Physical Exam: VS:  BP (!) 153/76   Pulse 68   Temp 98 F (36.7 C)   Ht 5\' 3"  (1.6 m)   Wt 109 lb 6.4 oz (49.6 kg)   SpO2 97%   BMI 19.38 kg/m , BMI Body mass index is 19.38 kg/m.  Wt Readings from Last 3  Encounters:  04/28/19 109 lb 6.4 oz (49.6 kg)  08/17/18 102 lb 12.8 oz (46.6 kg)  01/03/18 106 lb (48.1 kg)    General: Elderly woman, appears comfortable at rest. HEENT: Conjunctiva and lids normal, wearing a mask. Neck: Supple, no elevated JVP or carotid bruits, no thyromegaly. Lungs: Clear to auscultation, nonlabored breathing at rest. Cardiac: Regular rate and rhythm, no S3 or significant systolic murmur. Abdomen: Soft, nontender, bowel sounds present. Extremities: No pitting edema, distal pulses 2+.  ECG:  An ECG dated 01/05/2018 was personally reviewed today and demonstrated:  Sinus rhythm with left anterior fascicular block and increased voltage.  Recent  Labwork:  April 2019: Hemoglobin 13.2, platelets 236, BUN 19, creatinine 0.76, potassium 4.5, AST 22, ALT 10, TSH 1.19, cholesterol 175, triglycerides 76, HDL 70, LDL 90, free T4 1.43  Other Studies Reviewed Today:  No interval cardiac testing.  Assessment and Plan:  1.  Paroxysmal atrial fibrillation and history of atrial flutter ablation.  She consistently has declined anticoagulation and remains on aspirin along with low-dose amiodarone.  Requesting interval lab work from PCP.  ECG reviewed, she is in sinus rhythm today.  2.  Essential hypertension, systolic is in the 166A today.  Keep follow-up with Dr. Manuella Ghazi.  Medication Adjustments/Labs and Tests Ordered: Current medicines are reviewed at length with the patient today.  Concerns regarding medicines are outlined above.   Tests Ordered: No orders of the defined types were placed in this encounter.   Medication Changes: No orders of the defined types were placed in this encounter.   Disposition:  Follow up 6 months in the [place] office.  Signed, Satira Sark, MD, [facility] 04/28/2019 10:52 AM    [facility] at [place], [place], [place] [address] Phone: [phone]; Fax: [phone]

## 2019-04-28 NOTE — Addendum Note (Signed)
Addended by: Laurine Blazer on: 04/28/2019 04:09 PM   Modules accepted: Orders

## 2019-05-11 DIAGNOSIS — H353211 Exudative age-related macular degeneration, right eye, with active choroidal neovascularization: Secondary | ICD-10-CM | POA: Diagnosis not present

## 2019-05-30 ENCOUNTER — Other Ambulatory Visit: Payer: Self-pay | Admitting: Cardiology

## 2019-06-14 DIAGNOSIS — I10 Essential (primary) hypertension: Secondary | ICD-10-CM | POA: Diagnosis not present

## 2019-06-14 DIAGNOSIS — I4891 Unspecified atrial fibrillation: Secondary | ICD-10-CM | POA: Diagnosis not present

## 2019-06-14 DIAGNOSIS — E7800 Pure hypercholesterolemia, unspecified: Secondary | ICD-10-CM | POA: Diagnosis not present

## 2019-06-14 DIAGNOSIS — Z23 Encounter for immunization: Secondary | ICD-10-CM | POA: Diagnosis not present

## 2019-06-23 DIAGNOSIS — F419 Anxiety disorder, unspecified: Secondary | ICD-10-CM | POA: Diagnosis not present

## 2019-06-23 DIAGNOSIS — Z6820 Body mass index (BMI) 20.0-20.9, adult: Secondary | ICD-10-CM | POA: Diagnosis not present

## 2019-06-23 DIAGNOSIS — Z299 Encounter for prophylactic measures, unspecified: Secondary | ICD-10-CM | POA: Diagnosis not present

## 2019-06-23 DIAGNOSIS — I4892 Unspecified atrial flutter: Secondary | ICD-10-CM | POA: Diagnosis not present

## 2019-06-23 DIAGNOSIS — I10 Essential (primary) hypertension: Secondary | ICD-10-CM | POA: Diagnosis not present

## 2019-06-23 DIAGNOSIS — R350 Frequency of micturition: Secondary | ICD-10-CM | POA: Diagnosis not present

## 2019-07-14 DIAGNOSIS — M25562 Pain in left knee: Secondary | ICD-10-CM | POA: Diagnosis not present

## 2019-07-14 DIAGNOSIS — I10 Essential (primary) hypertension: Secondary | ICD-10-CM | POA: Diagnosis not present

## 2019-07-14 DIAGNOSIS — Z299 Encounter for prophylactic measures, unspecified: Secondary | ICD-10-CM | POA: Diagnosis not present

## 2019-07-14 DIAGNOSIS — Z6820 Body mass index (BMI) 20.0-20.9, adult: Secondary | ICD-10-CM | POA: Diagnosis not present

## 2019-07-28 DIAGNOSIS — I10 Essential (primary) hypertension: Secondary | ICD-10-CM | POA: Diagnosis not present

## 2019-07-28 DIAGNOSIS — Z681 Body mass index (BMI) 19 or less, adult: Secondary | ICD-10-CM | POA: Diagnosis not present

## 2019-07-28 DIAGNOSIS — Z713 Dietary counseling and surveillance: Secondary | ICD-10-CM | POA: Diagnosis not present

## 2019-07-28 DIAGNOSIS — Z299 Encounter for prophylactic measures, unspecified: Secondary | ICD-10-CM | POA: Diagnosis not present

## 2019-07-28 DIAGNOSIS — E039 Hypothyroidism, unspecified: Secondary | ICD-10-CM | POA: Diagnosis not present

## 2019-08-15 DIAGNOSIS — H353211 Exudative age-related macular degeneration, right eye, with active choroidal neovascularization: Secondary | ICD-10-CM | POA: Diagnosis not present

## 2019-08-15 DIAGNOSIS — H353122 Nonexudative age-related macular degeneration, left eye, intermediate dry stage: Secondary | ICD-10-CM | POA: Diagnosis not present

## 2019-08-15 DIAGNOSIS — H35373 Puckering of macula, bilateral: Secondary | ICD-10-CM | POA: Diagnosis not present

## 2019-08-28 DIAGNOSIS — I10 Essential (primary) hypertension: Secondary | ICD-10-CM | POA: Diagnosis not present

## 2019-08-28 DIAGNOSIS — I4891 Unspecified atrial fibrillation: Secondary | ICD-10-CM | POA: Diagnosis not present

## 2019-08-28 DIAGNOSIS — E7800 Pure hypercholesterolemia, unspecified: Secondary | ICD-10-CM | POA: Diagnosis not present

## 2019-09-04 DIAGNOSIS — I4891 Unspecified atrial fibrillation: Secondary | ICD-10-CM | POA: Diagnosis not present

## 2019-09-04 DIAGNOSIS — E7800 Pure hypercholesterolemia, unspecified: Secondary | ICD-10-CM | POA: Diagnosis not present

## 2019-09-04 DIAGNOSIS — I10 Essential (primary) hypertension: Secondary | ICD-10-CM | POA: Diagnosis not present

## 2019-09-19 DIAGNOSIS — H401132 Primary open-angle glaucoma, bilateral, moderate stage: Secondary | ICD-10-CM | POA: Diagnosis not present

## 2019-09-26 DIAGNOSIS — Z789 Other specified health status: Secondary | ICD-10-CM | POA: Diagnosis not present

## 2019-09-26 DIAGNOSIS — Z299 Encounter for prophylactic measures, unspecified: Secondary | ICD-10-CM | POA: Diagnosis not present

## 2019-09-26 DIAGNOSIS — I4892 Unspecified atrial flutter: Secondary | ICD-10-CM | POA: Diagnosis not present

## 2019-09-26 DIAGNOSIS — M25552 Pain in left hip: Secondary | ICD-10-CM | POA: Diagnosis not present

## 2019-09-26 DIAGNOSIS — I10 Essential (primary) hypertension: Secondary | ICD-10-CM | POA: Diagnosis not present

## 2019-09-26 DIAGNOSIS — I209 Angina pectoris, unspecified: Secondary | ICD-10-CM | POA: Diagnosis not present

## 2019-09-26 DIAGNOSIS — Z6820 Body mass index (BMI) 20.0-20.9, adult: Secondary | ICD-10-CM | POA: Diagnosis not present

## 2019-10-27 DIAGNOSIS — I4891 Unspecified atrial fibrillation: Secondary | ICD-10-CM | POA: Diagnosis not present

## 2019-10-27 DIAGNOSIS — E7800 Pure hypercholesterolemia, unspecified: Secondary | ICD-10-CM | POA: Diagnosis not present

## 2019-10-27 DIAGNOSIS — I10 Essential (primary) hypertension: Secondary | ICD-10-CM | POA: Diagnosis not present

## 2019-11-06 DIAGNOSIS — Z23 Encounter for immunization: Secondary | ICD-10-CM | POA: Diagnosis not present

## 2019-11-20 ENCOUNTER — Telehealth: Payer: Self-pay | Admitting: Cardiology

## 2019-11-20 NOTE — Telephone Encounter (Signed)
Virtual Visit Pre-Appointment Phone Call  "(Name), I am calling you today to discuss your upcoming appointment. We are currently trying to limit exposure to the virus that causes COVID-19 by seeing patients at home rather than in the office."  1. "What is the BEST phone number to call the day of the visit?" -  [phone] 2.  3. "Do you have or have access to (through a family member/friend) a smartphone with video capability that we can use for your visit?" a. If yes - list this number in appt notes as "cell" (if different from BEST phone #) and list the appointment type as a VIDEO visit in appointment notes b. If no - list the appointment type as a PHONE visit in appointment notes  4. Confirm consent - "In the setting of the current Covid19 crisis, you are scheduled for a (phone or video) visit with your provider on (date) at (time).  Just as we do with many in-office visits, in order for you to participate in this visit, we must obtain consent.  If you'd like, I can send this to your mychart (if signed up) or email for you to review.  Otherwise, I can obtain your verbal consent now.  All virtual visits are billed to your insurance company just like a normal visit would be.  By agreeing to a virtual visit, we'd like you to understand that the technology does not allow for your provider to perform an examination, and thus may limit your provider's ability to fully assess your condition. If your provider identifies any concerns that need to be evaluated in person, we will make arrangements to do so.  Finally, though the technology is pretty good, we cannot assure that it will always work on either your or our end, and in the setting of a video visit, we may have to convert it to a phone-only visit.  In either situation, we cannot ensure that we have a secure connection.  Are you willing to proceed?" STAFF: Did the patient verbally acknowledge consent to telehealth visit? Document YES/NO here:   YES  5. Advise patient to be prepared - "Two hours prior to your appointment, go ahead and check your blood pressure, pulse, oxygen saturation, and your weight (if you have the equipment to check those) and write them all down. When your visit starts, your provider will ask you for this information. If you have an Apple Watch or Kardia device, please plan to have heart rate information ready on the day of your appointment. Please have a pen and paper handy nearby the day of the visit as well."  6. Give patient instructions for MyChart download to smartphone OR Doximity/Doxy.me as below if video visit (depending on what platform provider is using)  7. Inform patient they will receive a phone call 15 minutes prior to their appointment time (may be from unknown caller ID) so they should be prepared to answer    TELEPHONE CALL NOTE  Erica [name] has been deemed a candidate for a follow-up tele-health visit to limit community exposure during the Covid-19 pandemic. I spoke with the patient via phone to ensure availability of phone/video source, confirm preferred email & phone number, and discuss instructions and expectations.  I reminded Erica [name] to be prepared with any vital sign and/or heart rhythm information that could potentially be obtained via home monitoring, at the time of her visit. I reminded Erica [name] to expect a phone call prior to her  visit.  Megan Salon 11/20/2019 2:53 PM   INSTRUCTIONS FOR DOWNLOADING THE MYCHART APP TO SMARTPHONE  - The patient must first make sure to have activated MyChart and know their login information - If Apple, go to [employer] and type in MyChart in the search bar and download the app. If Android, ask patient to go to [employer] and type in [place] in the search bar and download the app. The app is free but as with any other app downloads, their phone may require them to verify saved payment information or Apple/Android  password.  - The patient will need to then log into the app with their MyChart username and password, and select [facility] as their healthcare provider to link the account. When it is time for your visit, go to the MyChart app, find appointments, and click Begin Video Visit. Be sure to Select Allow for your device to access the Microphone and Camera for your visit. You will then be connected, and your provider will be with you shortly.  **If they have any issues connecting, or need assistance please contact MyChart service desk [phone])**  **If using a computer, in order to ensure the best quality for their visit they will need to use either of the following Internet Browsers: [employer] or Google Chrome**  IF USING DOXIMITY or DOXY.ME - The patient will receive a link just prior to their visit by text.     FULL LENGTH CONSENT FOR TELE-HEALTH VISIT   I hereby voluntarily request, consent and authorize [facility] and its employed or contracted physicians, physician assistants, nurse practitioners or other licensed health care professionals (the Practitioner), to provide me with telemedicine health care services (the "Services") as deemed necessary by the treating Practitioner. I acknowledge and consent to receive the Services by the Practitioner via telemedicine. I understand that the telemedicine visit will involve communicating with the Practitioner through live audiovisual communication technology and the disclosure of certain medical information by electronic transmission. I acknowledge that I have been given the opportunity to request an in-person assessment or other available alternative prior to the telemedicine visit and am voluntarily participating in the telemedicine visit.  I understand that I have the right to withhold or withdraw my consent to the use of telemedicine in the course of my care at any time, without affecting my right to future care or treatment,  and that the Practitioner or I may terminate the telemedicine visit at any time. I understand that I have the right to inspect all information obtained and/or recorded in the course of the telemedicine visit and may receive copies of available information for a reasonable fee.  I understand that some of the potential risks of receiving the Services via telemedicine include:  Marland Kitchen Delay or interruption in medical evaluation due to technological equipment failure or disruption; . Information transmitted may not be sufficient (e.g. poor resolution of images) to allow for appropriate medical decision making by the Practitioner; and/or  . In rare instances, security protocols could fail, causing a breach of personal health information.  Furthermore, I acknowledge that it is my responsibility to provide information about my medical history, conditions and care that is complete and accurate to the best of my ability. I acknowledge that Practitioner's advice, recommendations, and/or decision may be based on factors not within their control, such as incomplete or inaccurate data provided by me or distortions of diagnostic images or specimens that may result from electronic transmissions. I understand  that the practice of medicine is not an exact science and that Practitioner makes no warranties or guarantees regarding treatment outcomes. I acknowledge that I will receive a copy of this consent concurrently upon execution via email to the email address I last provided but may also request a printed copy by calling the office of [facility].    I understand that my insurance will be billed for this visit.   I have read or had this consent read to me. . I understand the contents of this consent, which adequately explains the benefits and risks of the Services being provided via telemedicine.  . I have been provided ample opportunity to ask questions regarding this consent and the Services and have had my questions  answered to my satisfaction. . I give my informed consent for the services to be provided through the use of telemedicine in my medical care  By participating in this telemedicine visit I agree to the above.

## 2019-11-21 DIAGNOSIS — E7800 Pure hypercholesterolemia, unspecified: Secondary | ICD-10-CM | POA: Diagnosis not present

## 2019-11-21 DIAGNOSIS — I4891 Unspecified atrial fibrillation: Secondary | ICD-10-CM | POA: Diagnosis not present

## 2019-11-21 DIAGNOSIS — I10 Essential (primary) hypertension: Secondary | ICD-10-CM | POA: Diagnosis not present

## 2019-11-22 ENCOUNTER — Telehealth (INDEPENDENT_AMBULATORY_CARE_PROVIDER_SITE_OTHER): Payer: Medicare Other | Admitting: Cardiology

## 2019-11-22 ENCOUNTER — Encounter: Payer: Self-pay | Admitting: Cardiology

## 2019-11-22 ENCOUNTER — Other Ambulatory Visit: Payer: Self-pay

## 2019-11-22 DIAGNOSIS — I10 Essential (primary) hypertension: Secondary | ICD-10-CM

## 2019-11-22 DIAGNOSIS — I480 Paroxysmal atrial fibrillation: Secondary | ICD-10-CM

## 2019-11-22 NOTE — Progress Notes (Signed)
Virtual Visit via Telephone Note   This visit type was conducted due to national recommendations for restrictions regarding the COVID-19 Pandemic (e.g. social distancing) in an effort to limit this patient's exposure and mitigate transmission in our community.  Due to her co-morbid illnesses, this patient is at least at moderate risk for complications without adequate follow up.  This format is felt to be most appropriate for this patient at this time.  The patient did not have access to video technology/had technical difficulties with video requiring transitioning to audio format only (telephone).  All issues noted in this document were discussed and addressed.  No physical exam could be performed with this format.  Please refer to the patient's chart for her  consent to telehealth for [facility].   The patient was identified using 2 identifiers.  Date:  11/22/2019   ID:  Erica [name], DOB [birth year], MRN [id number]  Patient Location: Home Provider Location: Office  PCP:  Monico Blitz, MD  Cardiologist:  Rozann Lesches, MD Electrophysiologist:  None   Evaluation Performed:  Follow-Up Visit  Chief Complaint:  Cardiac follow-up  History of Present Illness:    Erica [name] is an 86 y.o. female last seen in August 2020.  We spoke by phone today.  She tells me that since last encounter she has done fairly well.  She does not feel any progressive sense of palpitations, no increasing breathlessness.  She just recently got the second dose of the coronavirus vaccine and has done well with it.  I reviewed her medications which are outlined below.  She remains on aspirin and amiodarone.  She has consistently declined anticoagulation.  I did review her lab work obtained by PCP from May of last year.  Thyroid and liver function studies were normal.   Past Medical History:  Diagnosis Date  . Atrial fibrillation (HCC)    Declines Coumadin  . Atrial flutter ([place])    Status post RFA   . Interstitial lung disease (HCC)    Mild, DLCO of 68%   Past Surgical History:  Procedure Laterality Date  . LAPAROSCOPIC INGUINAL HERNIA REPAIR Right 01/15/2014     Current Meds  Medication Sig  . ALPRAZolam (XANAX) 0.25 MG tablet Take 0.125 mg by mouth 2 (two) times daily as needed.   Marland Kitchen amiodarone (PACERONE) 200 MG tablet TAKE 1/2 TABLET BY MOUTH DAILY.  Marland Kitchen aspirin 325 MG tablet Take 1 tablet (325 mg total) by mouth daily.  . brimonidine (ALPHAGAN) 0.2 % ophthalmic solution Place 1 drop into both eyes daily.  . Calcium Carbonate-Vitamin D (CALTRATE 600+D PO) Take 1 tablet by mouth 2 (two) times daily.  Marland Kitchen levothyroxine (SYNTHROID, LEVOTHROID) 50 MCG tablet Take 50 mcg by mouth daily before breakfast.  . Multiple Vitamin (MULTIVITAMIN) tablet Take 1 tablet by mouth daily.  . Multiple Vitamins-Minerals (PRESERVISION AREDS PO) Take 1 tablet by mouth daily.   Marland Kitchen omeprazole (PRILOSEC) 20 MG capsule Take 20 mg by mouth 2 (two) times daily.   Vladimir Faster Glycol-Propyl Glycol (SYSTANE OP) Apply 2 drops to eye daily.  . polyethylene glycol (MIRALAX / GLYCOLAX) packet Take 17 g by mouth as needed.      Allergies:   Ciprofloxacin   ROS:   No chest pain, no syncope.  Prior CV studies:   The following studies were reviewed today:  No interval cardiac testing for review.  Labs/Other Tests and Data Reviewed:    EKG:  An ECG dated 04/28/2019 was personally reviewed today  and demonstrated:  Sinus rhythm with increased voltage, normal QTc.  Recent Labs:  May 2020: Hemoglobin 13.5, platelets 287, cholesterol 203, triglycerides 92, HDL 62, LDL 123, BUN 16, creatinine 0.68, potassium 4.0, AST 20, ALT 10, TSH 2.02  Wt Readings from Last 3 Encounters:  11/22/19 110 lb (49.9 kg)  04/28/19 109 lb 6.4 oz (49.6 kg)  08/17/18 102 lb 12.8 oz (46.6 kg)     Objective:    Vital Signs:  BP (!) 144/67   Pulse 67   Ht 5\' 3"  (1.6 m)   Wt 110 lb (49.9 kg)   BMI 19.49 kg/m    Patient spoke in full  sentences, not obviously short of breath. No audible wheezing or coughing.  ASSESSMENT & PLAN:    1.  Paroxysmal atrial fibrillation as well as history of previous atrial flutter ablation.  CHA2DS2-VASc score is 3.  She does not report any progressive sense of palpitations.  She declines anticoagulation and has continued on aspirin along with low-dose amiodarone.  I reviewed her lab work from May 2020.  Continue with observation.  2.  Essential hypertension, systolic is in the 140s today.  She is currently not on any antihypertensive therapy, follows with Dr. June 2020.   Time:   Today, I have spent 5 minutes with the patient with telehealth technology discussing the above problems.     Medication Adjustments/Labs and Tests Ordered: Current medicines are reviewed at length with the patient today.  Concerns regarding medicines are outlined above.   Tests Ordered: No orders of the defined types were placed in this encounter.   Medication Changes: No orders of the defined types were placed in this encounter.   Follow Up:  In Person 6 months in the [place] office.  Signed, [place], MD  11/22/2019 4:00 PM    [facility]

## 2019-11-22 NOTE — Patient Instructions (Addendum)

## 2019-11-24 DIAGNOSIS — Z23 Encounter for immunization: Secondary | ICD-10-CM | POA: Diagnosis not present

## 2019-11-30 DIAGNOSIS — H35373 Puckering of macula, bilateral: Secondary | ICD-10-CM | POA: Diagnosis not present

## 2019-11-30 DIAGNOSIS — H353211 Exudative age-related macular degeneration, right eye, with active choroidal neovascularization: Secondary | ICD-10-CM | POA: Diagnosis not present

## 2019-11-30 DIAGNOSIS — H353122 Nonexudative age-related macular degeneration, left eye, intermediate dry stage: Secondary | ICD-10-CM | POA: Diagnosis not present

## 2019-11-30 DIAGNOSIS — H401132 Primary open-angle glaucoma, bilateral, moderate stage: Secondary | ICD-10-CM | POA: Diagnosis not present

## 2019-12-15 DIAGNOSIS — Z299 Encounter for prophylactic measures, unspecified: Secondary | ICD-10-CM | POA: Diagnosis not present

## 2019-12-15 DIAGNOSIS — I4892 Unspecified atrial flutter: Secondary | ICD-10-CM | POA: Diagnosis not present

## 2019-12-15 DIAGNOSIS — M7062 Trochanteric bursitis, left hip: Secondary | ICD-10-CM | POA: Diagnosis not present

## 2019-12-15 DIAGNOSIS — I10 Essential (primary) hypertension: Secondary | ICD-10-CM | POA: Diagnosis not present

## 2019-12-21 DIAGNOSIS — Z299 Encounter for prophylactic measures, unspecified: Secondary | ICD-10-CM | POA: Diagnosis not present

## 2019-12-21 DIAGNOSIS — M25552 Pain in left hip: Secondary | ICD-10-CM | POA: Diagnosis not present

## 2019-12-21 DIAGNOSIS — I209 Angina pectoris, unspecified: Secondary | ICD-10-CM | POA: Diagnosis not present

## 2019-12-21 DIAGNOSIS — I10 Essential (primary) hypertension: Secondary | ICD-10-CM | POA: Diagnosis not present

## 2019-12-21 DIAGNOSIS — I4892 Unspecified atrial flutter: Secondary | ICD-10-CM | POA: Diagnosis not present

## 2019-12-21 DIAGNOSIS — J849 Interstitial pulmonary disease, unspecified: Secondary | ICD-10-CM | POA: Diagnosis not present

## 2019-12-27 ENCOUNTER — Other Ambulatory Visit: Payer: Self-pay | Admitting: Cardiology

## 2019-12-27 DIAGNOSIS — I10 Essential (primary) hypertension: Secondary | ICD-10-CM | POA: Diagnosis not present

## 2019-12-27 DIAGNOSIS — I4891 Unspecified atrial fibrillation: Secondary | ICD-10-CM | POA: Diagnosis not present

## 2019-12-27 DIAGNOSIS — E7800 Pure hypercholesterolemia, unspecified: Secondary | ICD-10-CM | POA: Diagnosis not present

## 2020-01-05 DIAGNOSIS — I4892 Unspecified atrial flutter: Secondary | ICD-10-CM | POA: Diagnosis not present

## 2020-01-05 DIAGNOSIS — J849 Interstitial pulmonary disease, unspecified: Secondary | ICD-10-CM | POA: Diagnosis not present

## 2020-01-05 DIAGNOSIS — I10 Essential (primary) hypertension: Secondary | ICD-10-CM | POA: Diagnosis not present

## 2020-01-05 DIAGNOSIS — Z299 Encounter for prophylactic measures, unspecified: Secondary | ICD-10-CM | POA: Diagnosis not present

## 2020-01-05 DIAGNOSIS — M25562 Pain in left knee: Secondary | ICD-10-CM | POA: Diagnosis not present

## 2020-01-12 ENCOUNTER — Other Ambulatory Visit: Payer: Self-pay | Admitting: *Deleted

## 2020-01-12 MED ORDER — AMIODARONE HCL 200 MG PO TABS: 100.0000 mg | ORAL_TABLET | Freq: Every day | ORAL | 1 refills | Status: DC

## 2020-01-23 DIAGNOSIS — E860 Dehydration: Secondary | ICD-10-CM | POA: Diagnosis not present

## 2020-01-23 DIAGNOSIS — E7800 Pure hypercholesterolemia, unspecified: Secondary | ICD-10-CM | POA: Diagnosis not present

## 2020-01-23 DIAGNOSIS — E46 Unspecified protein-calorie malnutrition: Secondary | ICD-10-CM | POA: Diagnosis not present

## 2020-01-23 DIAGNOSIS — M545 Low back pain: Secondary | ICD-10-CM | POA: Diagnosis not present

## 2020-01-23 DIAGNOSIS — Z299 Encounter for prophylactic measures, unspecified: Secondary | ICD-10-CM | POA: Diagnosis not present

## 2020-01-23 DIAGNOSIS — I10 Essential (primary) hypertension: Secondary | ICD-10-CM | POA: Diagnosis not present

## 2020-01-26 DIAGNOSIS — E7800 Pure hypercholesterolemia, unspecified: Secondary | ICD-10-CM | POA: Diagnosis not present

## 2020-01-26 DIAGNOSIS — Z1331 Encounter for screening for depression: Secondary | ICD-10-CM | POA: Diagnosis not present

## 2020-01-26 DIAGNOSIS — Z681 Body mass index (BMI) 19 or less, adult: Secondary | ICD-10-CM | POA: Diagnosis not present

## 2020-01-26 DIAGNOSIS — Z7189 Other specified counseling: Secondary | ICD-10-CM | POA: Diagnosis not present

## 2020-01-26 DIAGNOSIS — Z1339 Encounter for screening examination for other mental health and behavioral disorders: Secondary | ICD-10-CM | POA: Diagnosis not present

## 2020-01-26 DIAGNOSIS — H353211 Exudative age-related macular degeneration, right eye, with active choroidal neovascularization: Secondary | ICD-10-CM | POA: Diagnosis not present

## 2020-01-26 DIAGNOSIS — Z1211 Encounter for screening for malignant neoplasm of colon: Secondary | ICD-10-CM | POA: Diagnosis not present

## 2020-01-26 DIAGNOSIS — Z299 Encounter for prophylactic measures, unspecified: Secondary | ICD-10-CM | POA: Diagnosis not present

## 2020-01-26 DIAGNOSIS — Z0000 Encounter for general adult medical examination without abnormal findings: Secondary | ICD-10-CM | POA: Diagnosis not present

## 2020-01-26 DIAGNOSIS — I4892 Unspecified atrial flutter: Secondary | ICD-10-CM | POA: Diagnosis not present

## 2020-01-31 ENCOUNTER — Other Ambulatory Visit: Payer: Self-pay | Admitting: Cardiology

## 2020-01-31 MED ORDER — AMIODARONE HCL 200 MG PO TABS: 100.0000 mg | ORAL_TABLET | Freq: Every day | ORAL | 1 refills | Status: DC

## 2020-01-31 NOTE — Telephone Encounter (Signed)
*  STAT* If patient is at the pharmacy, call can be transferred to refill team.   1. Which medications need to be refilled? amiodarone (PACERONE) 200 MG tablet   2. Which pharmacy/location (including street and city if local pharmacy) is medication to be sent to? [facility]   3. Do they need a 30 day or 90 day supply?

## 2020-02-15 DIAGNOSIS — R110 Nausea: Secondary | ICD-10-CM | POA: Diagnosis not present

## 2020-02-15 DIAGNOSIS — E860 Dehydration: Secondary | ICD-10-CM | POA: Diagnosis not present

## 2020-02-15 DIAGNOSIS — K5900 Constipation, unspecified: Secondary | ICD-10-CM | POA: Diagnosis not present

## 2020-02-15 DIAGNOSIS — R1013 Epigastric pain: Secondary | ICD-10-CM | POA: Diagnosis not present

## 2020-03-19 DIAGNOSIS — I10 Essential (primary) hypertension: Secondary | ICD-10-CM | POA: Diagnosis not present

## 2020-03-19 DIAGNOSIS — Z681 Body mass index (BMI) 19 or less, adult: Secondary | ICD-10-CM | POA: Diagnosis not present

## 2020-03-19 DIAGNOSIS — I4892 Unspecified atrial flutter: Secondary | ICD-10-CM | POA: Diagnosis not present

## 2020-03-19 DIAGNOSIS — S22000A Wedge compression fracture of unspecified thoracic vertebra, initial encounter for closed fracture: Secondary | ICD-10-CM | POA: Diagnosis not present

## 2020-03-19 DIAGNOSIS — I700 Atherosclerosis of aorta: Secondary | ICD-10-CM | POA: Diagnosis not present

## 2020-03-19 DIAGNOSIS — Z299 Encounter for prophylactic measures, unspecified: Secondary | ICD-10-CM | POA: Diagnosis not present

## 2020-03-29 DIAGNOSIS — M47814 Spondylosis without myelopathy or radiculopathy, thoracic region: Secondary | ICD-10-CM | POA: Diagnosis not present

## 2020-03-29 DIAGNOSIS — I4891 Unspecified atrial fibrillation: Secondary | ICD-10-CM | POA: Diagnosis not present

## 2020-03-29 DIAGNOSIS — I10 Essential (primary) hypertension: Secondary | ICD-10-CM | POA: Diagnosis not present

## 2020-03-29 DIAGNOSIS — M5410 Radiculopathy, site unspecified: Secondary | ICD-10-CM | POA: Diagnosis not present

## 2020-03-29 DIAGNOSIS — E7800 Pure hypercholesterolemia, unspecified: Secondary | ICD-10-CM | POA: Diagnosis not present

## 2020-03-29 DIAGNOSIS — I2510 Atherosclerotic heart disease of native coronary artery without angina pectoris: Secondary | ICD-10-CM | POA: Diagnosis not present

## 2020-03-29 DIAGNOSIS — I700 Atherosclerosis of aorta: Secondary | ICD-10-CM | POA: Diagnosis not present

## 2020-03-29 DIAGNOSIS — S22060A Wedge compression fracture of T7-T8 vertebra, initial encounter for closed fracture: Secondary | ICD-10-CM | POA: Diagnosis not present

## 2020-04-03 DIAGNOSIS — Z299 Encounter for prophylactic measures, unspecified: Secondary | ICD-10-CM | POA: Diagnosis not present

## 2020-04-03 DIAGNOSIS — I10 Essential (primary) hypertension: Secondary | ICD-10-CM | POA: Diagnosis not present

## 2020-04-03 DIAGNOSIS — Z681 Body mass index (BMI) 19 or less, adult: Secondary | ICD-10-CM | POA: Diagnosis not present

## 2020-04-03 DIAGNOSIS — F419 Anxiety disorder, unspecified: Secondary | ICD-10-CM | POA: Diagnosis not present

## 2020-04-03 DIAGNOSIS — S22070A Wedge compression fracture of T9-T10 vertebra, initial encounter for closed fracture: Secondary | ICD-10-CM | POA: Diagnosis not present

## 2020-04-03 DIAGNOSIS — I700 Atherosclerosis of aorta: Secondary | ICD-10-CM | POA: Diagnosis not present

## 2020-04-16 DIAGNOSIS — S22070A Wedge compression fracture of T9-T10 vertebra, initial encounter for closed fracture: Secondary | ICD-10-CM | POA: Diagnosis not present

## 2020-04-16 DIAGNOSIS — S22060A Wedge compression fracture of T7-T8 vertebra, initial encounter for closed fracture: Secondary | ICD-10-CM | POA: Diagnosis not present

## 2020-04-16 DIAGNOSIS — M546 Pain in thoracic spine: Secondary | ICD-10-CM | POA: Diagnosis not present

## 2020-04-24 DIAGNOSIS — I10 Essential (primary) hypertension: Secondary | ICD-10-CM | POA: Diagnosis not present

## 2020-04-24 DIAGNOSIS — E7800 Pure hypercholesterolemia, unspecified: Secondary | ICD-10-CM | POA: Diagnosis not present

## 2020-04-24 DIAGNOSIS — I4891 Unspecified atrial fibrillation: Secondary | ICD-10-CM | POA: Diagnosis not present

## 2020-05-30 DIAGNOSIS — E7800 Pure hypercholesterolemia, unspecified: Secondary | ICD-10-CM | POA: Diagnosis not present

## 2020-05-30 DIAGNOSIS — I10 Essential (primary) hypertension: Secondary | ICD-10-CM | POA: Diagnosis not present

## 2020-05-30 DIAGNOSIS — I4891 Unspecified atrial fibrillation: Secondary | ICD-10-CM | POA: Diagnosis not present

## 2020-05-31 DIAGNOSIS — S22070D Wedge compression fracture of T9-T10 vertebra, subsequent encounter for fracture with routine healing: Secondary | ICD-10-CM | POA: Diagnosis not present

## 2020-06-10 ENCOUNTER — Ambulatory Visit (INDEPENDENT_AMBULATORY_CARE_PROVIDER_SITE_OTHER): Payer: Medicare Other | Admitting: Cardiology

## 2020-06-10 ENCOUNTER — Encounter: Payer: Self-pay | Admitting: *Deleted

## 2020-06-10 ENCOUNTER — Encounter: Payer: Self-pay | Admitting: Cardiology

## 2020-06-10 VITALS — BP 136/82 | HR 76 | Ht 63.0 in | Wt 94.4 lb

## 2020-06-10 DIAGNOSIS — M79605 Pain in left leg: Secondary | ICD-10-CM

## 2020-06-10 DIAGNOSIS — I10 Essential (primary) hypertension: Secondary | ICD-10-CM

## 2020-06-10 DIAGNOSIS — I480 Paroxysmal atrial fibrillation: Secondary | ICD-10-CM

## 2020-06-10 MED ORDER — AMIODARONE HCL 200 MG PO TABS: 100.0000 mg | ORAL_TABLET | Freq: Every day | ORAL | 1 refills | Status: DC

## 2020-06-10 NOTE — Progress Notes (Signed)
Cardiology Office Note  Date: 06/10/2020   ID: Erica [name], DOB 06-Sep-1927, MRN [id number]  PCP:  Kirstie Peri, MD  Cardiologist:  Nona Dell, MD Electrophysiologist:  None   Chief Complaint  Patient presents with  . Cardiac follow-up    History of Present Illness: Erica [name] is a 86 y.o. female last assessed via telehealth encounter in March.  She presents for a routine visit today.  Reports no palpitations or dizziness.  Uses a cane or walker to ambulate, denies any recent falls.  CHA2DS2-VASc score is 3, she consistently has declined anticoagulation.  She is on a full dose aspirin, also remains on low-dose amiodarone.  I personally reviewed her ECG today which shows sinus rhythm with PAC, increased voltage and nonspecific ST changes.  We are requesting her interval lab work from Dr. Sherryll Burger.  She also mentions that she has had some swelling and redness involving her left lower leg, cannot recall any injuries.  Seems to be somewhat better in the morning.  This has been present for the last few weeks.  Past Medical History:  Diagnosis Date  . Atrial fibrillation (HCC)    Declines Coumadin  . Atrial flutter (HCC)    Status post RFA  . Interstitial lung disease (HCC)    Mild, DLCO of 68%    Past Surgical History:  Procedure Laterality Date  . LAPAROSCOPIC INGUINAL HERNIA REPAIR Right 01/15/2014    Current Outpatient Medications  Medication Sig Dispense Refill  . ALPRAZolam (XANAX) 0.25 MG tablet Take 0.125 mg by mouth 2 (two) times daily as needed.     Marland Kitchen amiodarone (PACERONE) 200 MG tablet Take 0.5 tablets (100 mg total) by mouth daily. 45 tablet 1  . aspirin 325 MG tablet Take 1 tablet (325 mg total) by mouth daily. 90 tablet 3  . brimonidine (ALPHAGAN) 0.2 % ophthalmic solution Place 1 drop into both eyes daily.    . Calcium Carbonate-Vitamin D (CALTRATE 600+D PO) Take 1 tablet by mouth 2 (two) times daily.    Marland Kitchen levothyroxine (SYNTHROID, LEVOTHROID)  50 MCG tablet Take 50 mcg by mouth daily before breakfast.    . Multiple Vitamin (MULTIVITAMIN) tablet Take 1 tablet by mouth daily.    . Multiple Vitamins-Minerals (PRESERVISION AREDS PO) Take 1 tablet by mouth daily.     Marland Kitchen omeprazole (PRILOSEC) 20 MG capsule Take 20 mg by mouth 2 (two) times daily.     Bertram Gala Glycol-Propyl Glycol (SYSTANE OP) Apply 2 drops to eye daily.    . polyethylene glycol (MIRALAX / GLYCOLAX) packet Take 17 g by mouth as needed.     . traMADol (ULTRAM) 50 MG tablet Take by mouth every 6 (six) hours as needed.     No current facility-administered medications for this visit.   Allergies:  Ciprofloxacin   ROS: Chronic back and hip pain.  Physical Exam: VS:  BP 136/82   Pulse 76   Ht 5\' 3"  (1.6 m)   Wt 94 lb 6.4 oz (42.8 kg)   SpO2 99%   BMI 16.72 kg/m , BMI Body mass index is 16.72 kg/m.  Wt Readings from Last 3 Encounters:  06/10/20 94 lb 6.4 oz (42.8 kg)  11/22/19 110 lb (49.9 kg)  04/28/19 109 lb 6.4 oz (49.6 kg)    General: Elderly woman, appears comfortable at rest. HEENT: Conjunctiva and lids normal, wearing a mask. Neck: Supple, no elevated JVP or carotid bruits, no thyromegaly. Lungs: Clear to auscultation, nonlabored breathing at rest.  Cardiac: Regular rate and rhythm, no S3, soft systolic murmur . Extremities: 1+ edema and erythema left lower leg and ankle, distal pulses 2+.  ECG:  An ECG dated 04/28/2019 was personally reviewed today and demonstrated:  Sinus rhythm with increased voltage, normal QTc.  Recent Labwork:  May 2020: Hemoglobin 13.5, platelets 287, cholesterol 203, triglycerides 92, HDL 62, LDL 123, BUN 16, creatinine 0.68, potassium 4.0, AST 20, ALT 10, TSH 2.02  Other Studies Reviewed Today:  No interval cardiac testing for review today.  Assessment and Plan:  1.  Left leg swelling and erythema, no reported injury.  We will obtain lower extremity venous Dopplers to ensure no evidence of DVT.  2.  Paroxysmal atrial  fibrillation and prior history of atrial flutter ablation.  CHA2DS2-VASc score is 3.  She declines anticoagulation remains on full dose aspirin as well as low-dose amiodarone for be refilled.  Requesting interval lab work from Dr. Sherryll Burger.  Medication Adjustments/Labs and Tests Ordered: Current medicines are reviewed at length with the patient today.  Concerns regarding medicines are outlined above.   Tests Ordered: Orders Placed This Encounter  Procedures  . US Venous Img Lower Unilateral Left (DVT)  . EKG 12-Lead    Medication Changes: Meds ordered this encounter  Medications  . amiodarone (PACERONE) 200 MG tablet    Sig: Take 0.5 tablets (100 mg total) by mouth daily.    Dispense:  45 tablet    Refill:  1    This prescription was filled on 12/27/2019. Any refills authorized will be placed on file.    Disposition:  Follow up test results, anticipate 26-month visit.  Signed, Jonelle Sidle, MD, [facility] 06/10/2020 12:39 PM    [facility] at [facility] [address] [place], [place], [place] [address] Phone: [phone]; Fax: [phone]

## 2020-06-10 NOTE — Patient Instructions (Addendum)
Medication Instructions:   Your physician recommends that you continue on your current medications as directed. Please refer to the Current Medication list given to you today.  Labwork:  None  Testing/Procedures: Your physician has requested that you have a lower venous duplex. This test is an ultrasound of the veins in the legs. It looks at venous blood flow that carries blood from the heart to the legs or arms. Allow one hour for a Lower Venous exam. Allow thirty minutes for an Upper Venous exam. There are no restrictions or special instructions.  Follow-Up:  Your physician recommends that you schedule a follow-up appointment in: 6 months.  Any Other Special Instructions Will Be Listed Below (If Applicable).  If you need a refill on your cardiac medications before your next appointment, please call your pharmacy.

## 2020-06-14 ENCOUNTER — Ambulatory Visit (HOSPITAL_COMMUNITY)
Admission: RE | Admit: 2020-06-14 | Discharge: 2020-06-14 | Disposition: A | Discharge status: Home / Self Care | Payer: Medicare Other | Source: Ambulatory Visit | Attending: Cardiology | Admitting: Cardiology

## 2020-06-14 ENCOUNTER — Other Ambulatory Visit: Payer: Self-pay

## 2020-06-14 DIAGNOSIS — M79605 Pain in left leg: Secondary | ICD-10-CM | POA: Insufficient documentation

## 2020-06-19 ENCOUNTER — Telehealth: Payer: Self-pay | Admitting: *Deleted

## 2020-06-19 NOTE — Telephone Encounter (Signed)
-----   Message from Jonelle Sidle, MD sent at 06/16/2020  3:58 PM EDT ----- Results reviewed.  Please let her know that there was no evidence of venous clot involving her left leg.

## 2020-06-19 NOTE — Telephone Encounter (Signed)
Pt voiced understanding

## 2020-06-28 DIAGNOSIS — I4891 Unspecified atrial fibrillation: Secondary | ICD-10-CM | POA: Diagnosis not present

## 2020-06-28 DIAGNOSIS — I10 Essential (primary) hypertension: Secondary | ICD-10-CM | POA: Diagnosis not present

## 2020-06-28 DIAGNOSIS — E7800 Pure hypercholesterolemia, unspecified: Secondary | ICD-10-CM | POA: Diagnosis not present

## 2020-07-09 DIAGNOSIS — H353211 Exudative age-related macular degeneration, right eye, with active choroidal neovascularization: Secondary | ICD-10-CM | POA: Diagnosis not present

## 2020-07-09 DIAGNOSIS — H401132 Primary open-angle glaucoma, bilateral, moderate stage: Secondary | ICD-10-CM | POA: Diagnosis not present

## 2020-07-09 DIAGNOSIS — H353122 Nonexudative age-related macular degeneration, left eye, intermediate dry stage: Secondary | ICD-10-CM | POA: Diagnosis not present

## 2020-07-09 DIAGNOSIS — H35373 Puckering of macula, bilateral: Secondary | ICD-10-CM | POA: Diagnosis not present

## 2020-07-10 DIAGNOSIS — Z23 Encounter for immunization: Secondary | ICD-10-CM | POA: Diagnosis not present

## 2020-07-30 DIAGNOSIS — I4891 Unspecified atrial fibrillation: Secondary | ICD-10-CM | POA: Diagnosis not present

## 2020-07-30 DIAGNOSIS — E7800 Pure hypercholesterolemia, unspecified: Secondary | ICD-10-CM | POA: Diagnosis not present

## 2020-07-30 DIAGNOSIS — I10 Essential (primary) hypertension: Secondary | ICD-10-CM | POA: Diagnosis not present

## 2020-08-05 DIAGNOSIS — Z299 Encounter for prophylactic measures, unspecified: Secondary | ICD-10-CM | POA: Diagnosis not present

## 2020-08-05 DIAGNOSIS — Z681 Body mass index (BMI) 19 or less, adult: Secondary | ICD-10-CM | POA: Diagnosis not present

## 2020-08-05 DIAGNOSIS — I480 Paroxysmal atrial fibrillation: Secondary | ICD-10-CM | POA: Diagnosis not present

## 2020-08-05 DIAGNOSIS — I700 Atherosclerosis of aorta: Secondary | ICD-10-CM | POA: Diagnosis not present

## 2020-08-05 DIAGNOSIS — I10 Essential (primary) hypertension: Secondary | ICD-10-CM | POA: Diagnosis not present

## 2020-08-05 DIAGNOSIS — E039 Hypothyroidism, unspecified: Secondary | ICD-10-CM | POA: Diagnosis not present

## 2020-08-13 DIAGNOSIS — Z23 Encounter for immunization: Secondary | ICD-10-CM | POA: Diagnosis not present

## 2020-08-29 DIAGNOSIS — I10 Essential (primary) hypertension: Secondary | ICD-10-CM | POA: Diagnosis not present

## 2020-08-29 DIAGNOSIS — E7800 Pure hypercholesterolemia, unspecified: Secondary | ICD-10-CM | POA: Diagnosis not present

## 2020-08-29 DIAGNOSIS — I4891 Unspecified atrial fibrillation: Secondary | ICD-10-CM | POA: Diagnosis not present

## 2020-09-04 DIAGNOSIS — H401132 Primary open-angle glaucoma, bilateral, moderate stage: Secondary | ICD-10-CM | POA: Diagnosis not present

## 2020-09-16 ENCOUNTER — Other Ambulatory Visit: Payer: Self-pay | Admitting: Cardiology

## 2020-10-29 DIAGNOSIS — H401132 Primary open-angle glaucoma, bilateral, moderate stage: Secondary | ICD-10-CM | POA: Diagnosis not present

## 2020-10-29 DIAGNOSIS — H353122 Nonexudative age-related macular degeneration, left eye, intermediate dry stage: Secondary | ICD-10-CM | POA: Diagnosis not present

## 2020-10-29 DIAGNOSIS — H35373 Puckering of macula, bilateral: Secondary | ICD-10-CM | POA: Diagnosis not present

## 2020-10-29 DIAGNOSIS — H353211 Exudative age-related macular degeneration, right eye, with active choroidal neovascularization: Secondary | ICD-10-CM | POA: Diagnosis not present

## 2020-11-04 DIAGNOSIS — E039 Hypothyroidism, unspecified: Secondary | ICD-10-CM | POA: Diagnosis not present

## 2020-11-04 DIAGNOSIS — F419 Anxiety disorder, unspecified: Secondary | ICD-10-CM | POA: Diagnosis not present

## 2020-11-04 DIAGNOSIS — Z681 Body mass index (BMI) 19 or less, adult: Secondary | ICD-10-CM | POA: Diagnosis not present

## 2020-11-04 DIAGNOSIS — Z299 Encounter for prophylactic measures, unspecified: Secondary | ICD-10-CM | POA: Diagnosis not present

## 2020-11-04 DIAGNOSIS — Z789 Other specified health status: Secondary | ICD-10-CM | POA: Diagnosis not present

## 2020-11-04 DIAGNOSIS — I10 Essential (primary) hypertension: Secondary | ICD-10-CM | POA: Diagnosis not present

## 2020-11-04 DIAGNOSIS — J849 Interstitial pulmonary disease, unspecified: Secondary | ICD-10-CM | POA: Diagnosis not present

## 2021-01-29 DIAGNOSIS — Z0000 Encounter for general adult medical examination without abnormal findings: Secondary | ICD-10-CM | POA: Diagnosis not present

## 2021-01-29 DIAGNOSIS — Z7189 Other specified counseling: Secondary | ICD-10-CM | POA: Diagnosis not present

## 2021-01-29 DIAGNOSIS — E559 Vitamin D deficiency, unspecified: Secondary | ICD-10-CM | POA: Diagnosis not present

## 2021-01-29 DIAGNOSIS — E7800 Pure hypercholesterolemia, unspecified: Secondary | ICD-10-CM | POA: Diagnosis not present

## 2021-01-29 DIAGNOSIS — S22070A Wedge compression fracture of T9-T10 vertebra, initial encounter for closed fracture: Secondary | ICD-10-CM | POA: Diagnosis not present

## 2021-01-29 DIAGNOSIS — I471 Supraventricular tachycardia: Secondary | ICD-10-CM | POA: Diagnosis not present

## 2021-01-29 DIAGNOSIS — R5383 Other fatigue: Secondary | ICD-10-CM | POA: Diagnosis not present

## 2021-01-29 DIAGNOSIS — Z1331 Encounter for screening for depression: Secondary | ICD-10-CM | POA: Diagnosis not present

## 2021-01-29 DIAGNOSIS — Z299 Encounter for prophylactic measures, unspecified: Secondary | ICD-10-CM | POA: Diagnosis not present

## 2021-01-29 DIAGNOSIS — Z681 Body mass index (BMI) 19 or less, adult: Secondary | ICD-10-CM | POA: Diagnosis not present

## 2021-01-29 DIAGNOSIS — Z79899 Other long term (current) drug therapy: Secondary | ICD-10-CM | POA: Diagnosis not present

## 2021-01-29 DIAGNOSIS — Z1339 Encounter for screening examination for other mental health and behavioral disorders: Secondary | ICD-10-CM | POA: Diagnosis not present

## 2021-01-29 DIAGNOSIS — E039 Hypothyroidism, unspecified: Secondary | ICD-10-CM | POA: Diagnosis not present

## 2021-02-18 DIAGNOSIS — H353122 Nonexudative age-related macular degeneration, left eye, intermediate dry stage: Secondary | ICD-10-CM | POA: Diagnosis not present

## 2021-02-18 DIAGNOSIS — H353211 Exudative age-related macular degeneration, right eye, with active choroidal neovascularization: Secondary | ICD-10-CM | POA: Diagnosis not present

## 2021-02-18 DIAGNOSIS — H35373 Puckering of macula, bilateral: Secondary | ICD-10-CM | POA: Diagnosis not present

## 2021-02-18 DIAGNOSIS — H401132 Primary open-angle glaucoma, bilateral, moderate stage: Secondary | ICD-10-CM | POA: Diagnosis not present

## 2021-02-28 ENCOUNTER — Other Ambulatory Visit: Payer: Self-pay | Admitting: Cardiology

## 2021-03-13 DIAGNOSIS — H401132 Primary open-angle glaucoma, bilateral, moderate stage: Secondary | ICD-10-CM | POA: Diagnosis not present

## 2021-05-09 DIAGNOSIS — Z713 Dietary counseling and surveillance: Secondary | ICD-10-CM | POA: Diagnosis not present

## 2021-05-09 DIAGNOSIS — I10 Essential (primary) hypertension: Secondary | ICD-10-CM | POA: Diagnosis not present

## 2021-05-09 DIAGNOSIS — Z681 Body mass index (BMI) 19 or less, adult: Secondary | ICD-10-CM | POA: Diagnosis not present

## 2021-05-09 DIAGNOSIS — Z299 Encounter for prophylactic measures, unspecified: Secondary | ICD-10-CM | POA: Diagnosis not present

## 2021-05-09 DIAGNOSIS — Z23 Encounter for immunization: Secondary | ICD-10-CM | POA: Diagnosis not present

## 2021-05-12 ENCOUNTER — Telehealth: Payer: Self-pay | Admitting: Cardiology

## 2021-05-12 MED ORDER — AMIODARONE HCL 200 MG PO TABS
100.0000 mg | ORAL_TABLET | Freq: Every day | ORAL | 3 refills | Status: DC
Start: 2021-05-12 — End: 2022-02-25

## 2021-05-12 NOTE — Telephone Encounter (Addendum)
Appointment has been scheduled for 05/30/21 with Nena Polio, NP.    Refill sent to pharm.

## 2021-05-12 NOTE — Telephone Encounter (Signed)
Needing refill on amiodarone (PACERONE) 200 MG tablet [[id number]] sent to [facility]-   Will schedule for f/u apt

## 2021-05-29 NOTE — Progress Notes (Signed)
Cardiology Office Note  Date: 05/30/2021   ID: Erica [name], Erica [name] 1928-08-27, MRN [id number]  PCP:  Kirstie Peri, MD  Cardiologist:  Nona Dell, MD Electrophysiologist:  None   Chief Complaint: 7-month follow-up  History of Present Illness: Erica [name] is a 87 y.o. female with a history of atrial fibrillation, atrial flutter, interstitial lung disease.  She was last seen by Dr. Diona Browner on 06/10/2020.  She reported no palpitations or dizziness.  She was using a cane or walker to ambulate.  No recent falls.  CHA2DS2-VASc score was 3.  She has consistently declined anticoagulation.  She was continued on full dose aspirin and low-dose amiodarone.  EKG showed sinus rhythm with PAC.  Increased voltage and nonspecific ST changes.  Interval lab work was requested from Dr. Sherryll Burger.  She mentioned some swelling and redness in her left lower leg.  The lower extremity venous Doppler was ordered to ensure no evidence of DVT. Lower venous study was negative for DVT.  She is here for follow-up today.  She denies any recent acute illnesses or hospitalizations in the interim.  She states she has macular degeneration and has periodic injections in her right eye.  Otherwise she denies any issues.  No complaints of anginal or exertional symptoms, orthostatic symptoms, CVA or TIA-like symptoms, PND, orthopnea, bleeding issues.  Denies any claudication-like symptoms, DVT or PE-like symptoms.  EKG today shows normal sinus rhythm rate of 75.   Past Medical History:  Diagnosis Date   Atrial fibrillation (HCC)    Declines Coumadin   Atrial flutter (HCC)    Status post RFA   Interstitial lung disease (HCC)    Mild, DLCO of 68%    Past Surgical History:  Procedure Laterality Date   LAPAROSCOPIC INGUINAL HERNIA REPAIR Right 01/15/2014    Current Outpatient Medications  Medication Sig Dispense Refill   ALPRAZolam (XANAX) 0.25 MG tablet Take 0.125 mg by mouth 2 (two) times daily as needed.       amiodarone (PACERONE) 200 MG tablet Take 0.5 tablets (100 mg total) by mouth daily. 45 tablet 3   aspirin 325 MG tablet Take 1 tablet (325 mg total) by mouth daily. 90 tablet 3   brimonidine (ALPHAGAN) 0.2 % ophthalmic solution Place 1 drop into both eyes daily.     Calcium Carbonate-Vitamin D (CALTRATE 600+D PO) Take 1 tablet by mouth 2 (two) times daily.     levothyroxine (SYNTHROID, LEVOTHROID) 50 MCG tablet Take 50 mcg by mouth daily before breakfast.     Multiple Vitamin (MULTIVITAMIN) tablet Take 1 tablet by mouth daily.     Multiple Vitamins-Minerals (PRESERVISION AREDS PO) Take 1 tablet by mouth daily.      omeprazole (PRILOSEC) 20 MG capsule Take 20 mg by mouth 2 (two) times daily.      Polyethyl Glycol-Propyl Glycol (SYSTANE OP) Apply 2 drops to eye daily.     polyethylene glycol (MIRALAX / GLYCOLAX) packet Take 17 g by mouth as needed.      traMADol (ULTRAM) 50 MG tablet Take by mouth every 6 (six) hours as needed.     No current facility-administered medications for this visit.   Allergies:  Ciprofloxacin   Social History: The patient  reports that she has never smoked. She has never used smokeless tobacco. She reports that she does not drink alcohol and does not use drugs.   Family History: The patient's family history includes Diabetes in her brother, brother, and brother; Heart disease in her brother,  brother, brother, father, and mother.   ROS:  Please see the history of present illness. Otherwise, complete review of systems is positive for none.  All other systems are reviewed and negative.   Physical Exam: VS:  BP (!) 148/82   Pulse 78   Ht 5\' 3"  (1.6 m)   Wt 103 lb (46.7 kg)   SpO2 93%   BMI 18.25 kg/m , BMI Body mass index is 18.25 kg/m.  Wt Readings from Last 3 Encounters:  05/30/21 103 lb (46.7 kg)  06/10/20 94 lb 6.4 oz (42.8 kg)  11/22/19 110 lb (49.9 kg)    General: Patient appears comfortable at rest. Neck: Supple, no elevated JVP or carotid bruits,  no thyromegaly. Lungs: Clear to auscultation, nonlabored breathing at rest. Cardiac: Regular rate and rhythm, no S3 or significant systolic murmur, no pericardial rub. Extremities: No pitting edema, distal pulses 2+. Skin: Warm and dry. Musculoskeletal: No kyphosis. Neuropsychiatric: Alert and oriented x3, affect grossly appropriate.  ECG: 05/30/2021 normal sinus rhythm.  Rate of 75, ST and T wave abnormality consider inferior ischemia and anterolateral ischemia.  Incomplete LBBB  Recent Labwork: No results found for requested labs within last 8760 hours.  No results found for: CHOL, TRIG, HDL, CHOLHDL, VLDL, LDLCALC, LDLDIRECT  Other Studies Reviewed Today:   Assessment and Plan:  1. Paroxysmal atrial fibrillation (HCC)   2. Edema of left lower extremity   3. Essential hypertension, benign   4. Interstitial lung disease (HCC)    1. Paroxysmal atrial fibrillation (HCC) Currently in normal sinus rhythm by EKG today with a rate of 75.  Denies any sensation of palpitations or arrhythmias.  Continue amiodarone 100 mg daily.  Continue aspirin 325 mg daily.  Not on anticoagulation.  She has deferred anticoagulation  2. Edema of left lower extremity No further edema of lower extremities either left or right.  3. Essential hypertension, benign Blood pressure is elevated today at 148/82.  Her daughter states she is anxiety when she comes to doctors offices.  Currently not on antihypertensives.  4. Interstitial lung disease (HCC) No complaints of any increased DOE or SOB.  She is not very active on a daily basis.  Medication Adjustments/Labs and Tests Ordered: Current medicines are reviewed at length with the patient today.  Concerns regarding medicines are outlined above.   Disposition: Follow-up with Dr. 06/01/2021 or APP 1 year  Signed, Diona Browner, NP 05/30/2021 2:25 PM    [facility] at [facility] [address] [place], [place], [place] Phone: [phone];  Fax: [phone]

## 2021-05-30 ENCOUNTER — Other Ambulatory Visit: Payer: Self-pay

## 2021-05-30 ENCOUNTER — Ambulatory Visit (INDEPENDENT_AMBULATORY_CARE_PROVIDER_SITE_OTHER): Payer: Medicare Other | Admitting: Family Medicine

## 2021-05-30 ENCOUNTER — Encounter: Payer: Self-pay | Admitting: Family Medicine

## 2021-05-30 VITALS — BP 148/82 | HR 78 | Ht 63.0 in | Wt 103.0 lb

## 2021-05-30 DIAGNOSIS — R600 Localized edema: Secondary | ICD-10-CM

## 2021-05-30 DIAGNOSIS — I480 Paroxysmal atrial fibrillation: Secondary | ICD-10-CM

## 2021-05-30 DIAGNOSIS — I10 Essential (primary) hypertension: Secondary | ICD-10-CM

## 2021-05-30 DIAGNOSIS — J849 Interstitial pulmonary disease, unspecified: Secondary | ICD-10-CM

## 2021-05-30 NOTE — Patient Instructions (Signed)
Medication Instructions:  Your physician recommends that you continue on your current medications as directed. Please refer to the Current Medication list given to you today.  *If you need a refill on your cardiac medications before your next appointment, please call your pharmacy*   Lab Work: None If you have labs (blood work) drawn today and your tests are completely normal, you will receive your results only by: MyChart Message (if you have MyChart) OR A paper copy in the mail If you have any lab test that is abnormal or we need to change your treatment, we will call you to review the results.   Testing/Procedures: None   Follow-Up: At [facility], you and your health needs are our priority.  As part of our continuing mission to provide you with exceptional heart care, we have created designated Provider Care Teams.  These Care Teams include your primary Cardiologist (physician) and Advanced Practice Providers (APPs -  Physician Assistants and Nurse Practitioners) who all work together to provide you with the care you need, when you need it.  We recommend signing up for the patient portal called "MyChart".  Sign up information is provided on this After Visit Summary.  MyChart is used to connect with patients for Virtual Visits (Telemedicine).  Patients are able to view lab/test results, encounter notes, upcoming appointments, etc.  Non-urgent messages can be sent to your provider as well.   To learn more about what you can do with MyChart, go to [email or web address].    Your next appointment:   1 year(s)  The format for your next appointment:   In Person  Provider:   Nena Polio, NP     Other Instructions

## 2021-06-18 DIAGNOSIS — H401132 Primary open-angle glaucoma, bilateral, moderate stage: Secondary | ICD-10-CM | POA: Diagnosis not present

## 2021-06-18 DIAGNOSIS — H353211 Exudative age-related macular degeneration, right eye, with active choroidal neovascularization: Secondary | ICD-10-CM | POA: Diagnosis not present

## 2021-06-18 DIAGNOSIS — H353122 Nonexudative age-related macular degeneration, left eye, intermediate dry stage: Secondary | ICD-10-CM | POA: Diagnosis not present

## 2021-06-18 DIAGNOSIS — H35373 Puckering of macula, bilateral: Secondary | ICD-10-CM | POA: Diagnosis not present

## 2021-07-02 DIAGNOSIS — J069 Acute upper respiratory infection, unspecified: Secondary | ICD-10-CM | POA: Diagnosis not present

## 2021-07-02 DIAGNOSIS — M25552 Pain in left hip: Secondary | ICD-10-CM | POA: Diagnosis not present

## 2021-07-02 DIAGNOSIS — R110 Nausea: Secondary | ICD-10-CM | POA: Diagnosis not present

## 2021-07-02 DIAGNOSIS — Z789 Other specified health status: Secondary | ICD-10-CM | POA: Diagnosis not present

## 2021-07-02 DIAGNOSIS — Z299 Encounter for prophylactic measures, unspecified: Secondary | ICD-10-CM | POA: Diagnosis not present

## 2021-07-02 DIAGNOSIS — G4700 Insomnia, unspecified: Secondary | ICD-10-CM | POA: Diagnosis not present

## 2021-07-29 DIAGNOSIS — Z789 Other specified health status: Secondary | ICD-10-CM | POA: Diagnosis not present

## 2021-07-29 DIAGNOSIS — M25552 Pain in left hip: Secondary | ICD-10-CM | POA: Diagnosis not present

## 2021-07-29 DIAGNOSIS — I10 Essential (primary) hypertension: Secondary | ICD-10-CM | POA: Diagnosis not present

## 2021-07-29 DIAGNOSIS — Z299 Encounter for prophylactic measures, unspecified: Secondary | ICD-10-CM | POA: Diagnosis not present

## 2021-07-29 DIAGNOSIS — Z681 Body mass index (BMI) 19 or less, adult: Secondary | ICD-10-CM | POA: Diagnosis not present

## 2021-09-03 DIAGNOSIS — Z20828 Contact with and (suspected) exposure to other viral communicable diseases: Secondary | ICD-10-CM | POA: Diagnosis not present

## 2021-09-08 DIAGNOSIS — I10 Essential (primary) hypertension: Secondary | ICD-10-CM | POA: Diagnosis not present

## 2021-09-08 DIAGNOSIS — H353211 Exudative age-related macular degeneration, right eye, with active choroidal neovascularization: Secondary | ICD-10-CM | POA: Diagnosis not present

## 2021-09-08 DIAGNOSIS — Z299 Encounter for prophylactic measures, unspecified: Secondary | ICD-10-CM | POA: Diagnosis not present

## 2021-09-08 DIAGNOSIS — I700 Atherosclerosis of aorta: Secondary | ICD-10-CM | POA: Diagnosis not present

## 2021-09-08 DIAGNOSIS — Z681 Body mass index (BMI) 19 or less, adult: Secondary | ICD-10-CM | POA: Diagnosis not present

## 2021-09-08 DIAGNOSIS — Z789 Other specified health status: Secondary | ICD-10-CM | POA: Diagnosis not present

## 2021-09-08 DIAGNOSIS — I4892 Unspecified atrial flutter: Secondary | ICD-10-CM | POA: Diagnosis not present

## 2021-09-18 DIAGNOSIS — H401132 Primary open-angle glaucoma, bilateral, moderate stage: Secondary | ICD-10-CM | POA: Diagnosis not present

## 2021-10-08 DIAGNOSIS — H401132 Primary open-angle glaucoma, bilateral, moderate stage: Secondary | ICD-10-CM | POA: Diagnosis not present

## 2021-10-08 DIAGNOSIS — H353122 Nonexudative age-related macular degeneration, left eye, intermediate dry stage: Secondary | ICD-10-CM | POA: Diagnosis not present

## 2021-10-08 DIAGNOSIS — H353211 Exudative age-related macular degeneration, right eye, with active choroidal neovascularization: Secondary | ICD-10-CM | POA: Diagnosis not present

## 2021-12-01 DIAGNOSIS — F419 Anxiety disorder, unspecified: Secondary | ICD-10-CM | POA: Diagnosis not present

## 2021-12-01 DIAGNOSIS — Z299 Encounter for prophylactic measures, unspecified: Secondary | ICD-10-CM | POA: Diagnosis not present

## 2021-12-01 DIAGNOSIS — Z681 Body mass index (BMI) 19 or less, adult: Secondary | ICD-10-CM | POA: Diagnosis not present

## 2021-12-01 DIAGNOSIS — I471 Supraventricular tachycardia: Secondary | ICD-10-CM | POA: Diagnosis not present

## 2021-12-01 DIAGNOSIS — I10 Essential (primary) hypertension: Secondary | ICD-10-CM | POA: Diagnosis not present

## 2021-12-01 DIAGNOSIS — M545 Low back pain, unspecified: Secondary | ICD-10-CM | POA: Diagnosis not present

## 2022-01-28 DIAGNOSIS — H353211 Exudative age-related macular degeneration, right eye, with active choroidal neovascularization: Secondary | ICD-10-CM | POA: Diagnosis not present

## 2022-01-28 DIAGNOSIS — H353122 Nonexudative age-related macular degeneration, left eye, intermediate dry stage: Secondary | ICD-10-CM | POA: Diagnosis not present

## 2022-01-28 DIAGNOSIS — H401132 Primary open-angle glaucoma, bilateral, moderate stage: Secondary | ICD-10-CM | POA: Diagnosis not present

## 2022-02-02 DIAGNOSIS — Z299 Encounter for prophylactic measures, unspecified: Secondary | ICD-10-CM | POA: Diagnosis not present

## 2022-02-02 DIAGNOSIS — Z79899 Other long term (current) drug therapy: Secondary | ICD-10-CM | POA: Diagnosis not present

## 2022-02-02 DIAGNOSIS — Z1339 Encounter for screening examination for other mental health and behavioral disorders: Secondary | ICD-10-CM | POA: Diagnosis not present

## 2022-02-02 DIAGNOSIS — Z7189 Other specified counseling: Secondary | ICD-10-CM | POA: Diagnosis not present

## 2022-02-02 DIAGNOSIS — S22070A Wedge compression fracture of T9-T10 vertebra, initial encounter for closed fracture: Secondary | ICD-10-CM | POA: Diagnosis not present

## 2022-02-02 DIAGNOSIS — E7800 Pure hypercholesterolemia, unspecified: Secondary | ICD-10-CM | POA: Diagnosis not present

## 2022-02-02 DIAGNOSIS — R5383 Other fatigue: Secondary | ICD-10-CM | POA: Diagnosis not present

## 2022-02-02 DIAGNOSIS — E039 Hypothyroidism, unspecified: Secondary | ICD-10-CM | POA: Diagnosis not present

## 2022-02-02 DIAGNOSIS — E559 Vitamin D deficiency, unspecified: Secondary | ICD-10-CM | POA: Diagnosis not present

## 2022-02-02 DIAGNOSIS — Z0000 Encounter for general adult medical examination without abnormal findings: Secondary | ICD-10-CM | POA: Diagnosis not present

## 2022-02-02 DIAGNOSIS — I10 Essential (primary) hypertension: Secondary | ICD-10-CM | POA: Diagnosis not present

## 2022-02-02 DIAGNOSIS — Z1331 Encounter for screening for depression: Secondary | ICD-10-CM | POA: Diagnosis not present

## 2022-02-02 DIAGNOSIS — Z681 Body mass index (BMI) 19 or less, adult: Secondary | ICD-10-CM | POA: Diagnosis not present

## 2022-02-25 ENCOUNTER — Other Ambulatory Visit: Payer: Self-pay | Admitting: Cardiology

## 2022-03-18 DIAGNOSIS — H40023 Open angle with borderline findings, high risk, bilateral: Secondary | ICD-10-CM | POA: Diagnosis not present

## 2022-03-18 DIAGNOSIS — H353122 Nonexudative age-related macular degeneration, left eye, intermediate dry stage: Secondary | ICD-10-CM | POA: Diagnosis not present

## 2022-03-18 DIAGNOSIS — H353211 Exudative age-related macular degeneration, right eye, with active choroidal neovascularization: Secondary | ICD-10-CM | POA: Diagnosis not present

## 2022-05-06 DIAGNOSIS — I10 Essential (primary) hypertension: Secondary | ICD-10-CM | POA: Diagnosis not present

## 2022-05-06 DIAGNOSIS — Z23 Encounter for immunization: Secondary | ICD-10-CM | POA: Diagnosis not present

## 2022-05-06 DIAGNOSIS — Z713 Dietary counseling and surveillance: Secondary | ICD-10-CM | POA: Diagnosis not present

## 2022-05-06 DIAGNOSIS — Z299 Encounter for prophylactic measures, unspecified: Secondary | ICD-10-CM | POA: Diagnosis not present

## 2022-05-06 DIAGNOSIS — Z681 Body mass index (BMI) 19 or less, adult: Secondary | ICD-10-CM | POA: Diagnosis not present

## 2022-05-27 DIAGNOSIS — H35373 Puckering of macula, bilateral: Secondary | ICD-10-CM | POA: Diagnosis not present

## 2022-05-27 DIAGNOSIS — H353211 Exudative age-related macular degeneration, right eye, with active choroidal neovascularization: Secondary | ICD-10-CM | POA: Diagnosis not present

## 2022-05-27 DIAGNOSIS — H40023 Open angle with borderline findings, high risk, bilateral: Secondary | ICD-10-CM | POA: Diagnosis not present

## 2022-05-27 DIAGNOSIS — H353122 Nonexudative age-related macular degeneration, left eye, intermediate dry stage: Secondary | ICD-10-CM | POA: Diagnosis not present

## 2022-07-10 IMAGING — US US EXTREM LOW VENOUS*L*
1 series · 14 of 24 positions shown · non-contrast
Comparison: None.

CLINICAL DATA: Leg pain

EXAM:
LEFT LOWER EXTREMITY VENOUS DOPPLER ULTRASOUND
TECHNIQUE: Gray-scale sonography with compression, as well as color and duplex
ultrasound, were performed to evaluate the deep venous system(s)
from the level of the common femoral vein through the popliteal and
proximal calf veins.

[Series 1: us venous img lower uni left (dvt) · portal-venous · 14 of 39 slices shown]
[im 1/39]
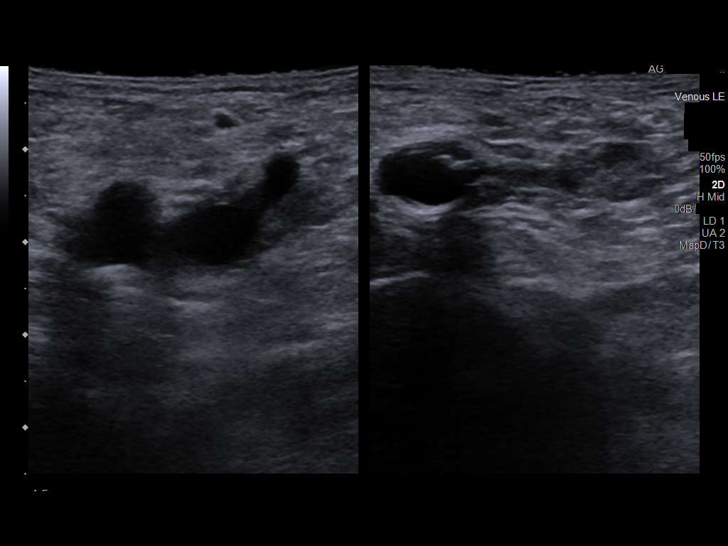
[im 4/39]
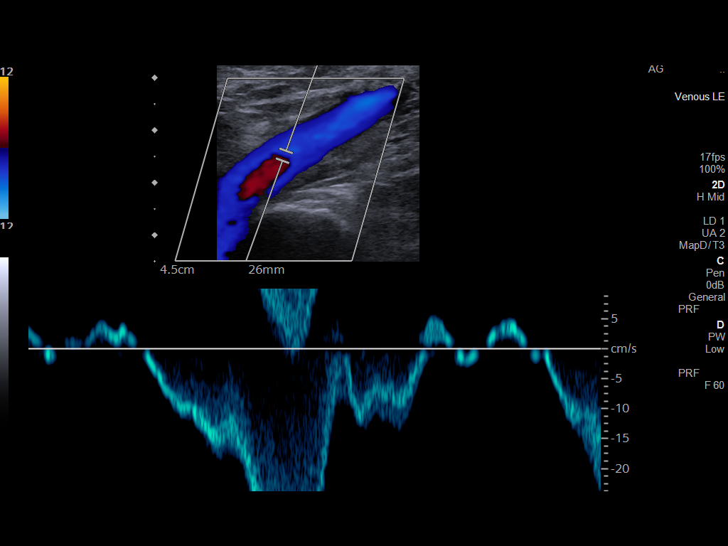
[im 7/39]
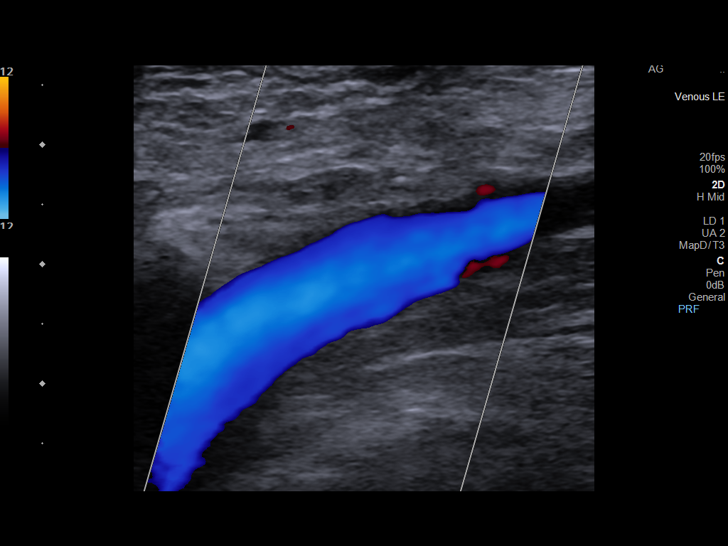
[im 10/39]
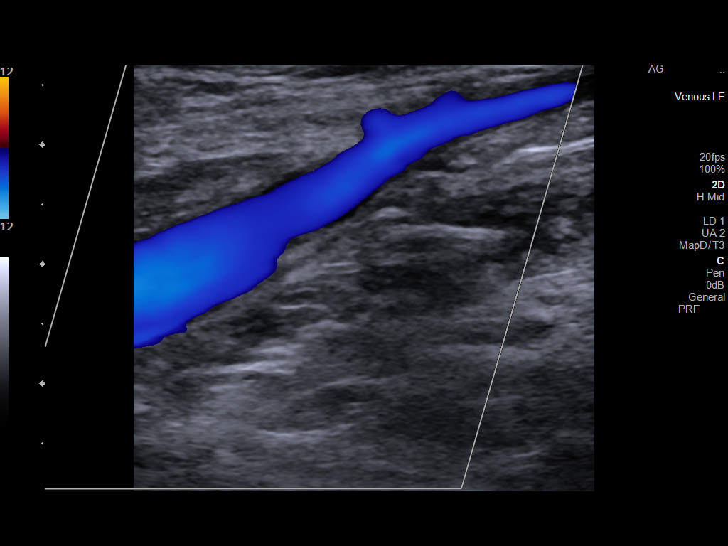
[im 12/39]
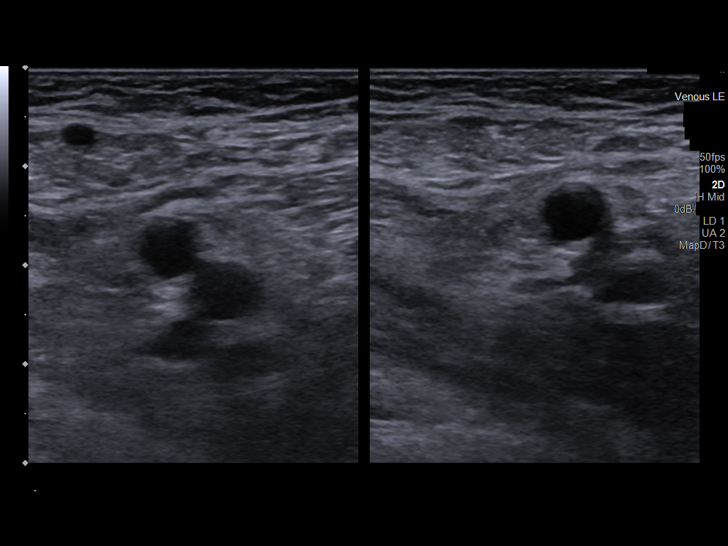
[im 15/39]
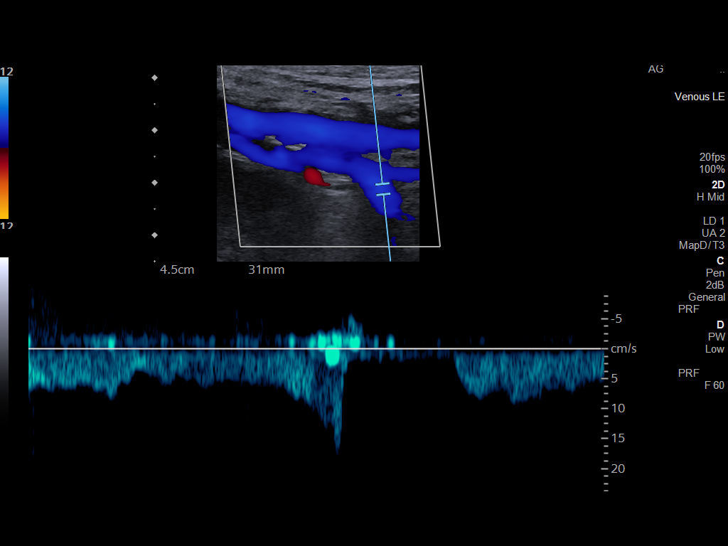
[im 19/39]
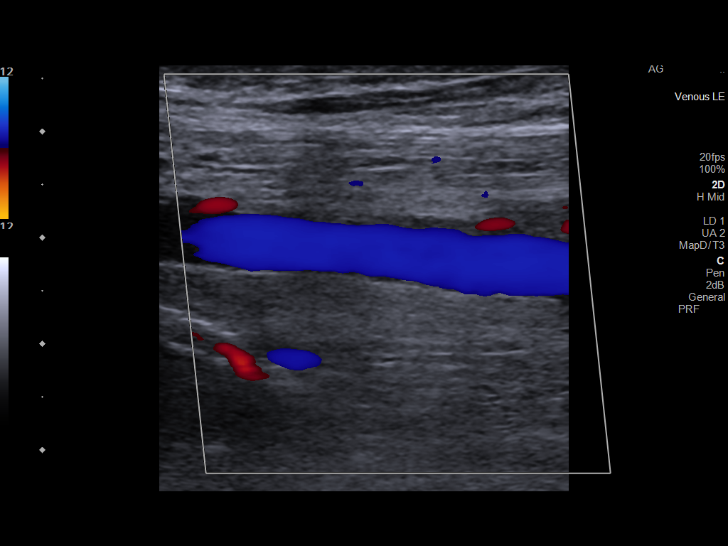
[im 20/39]
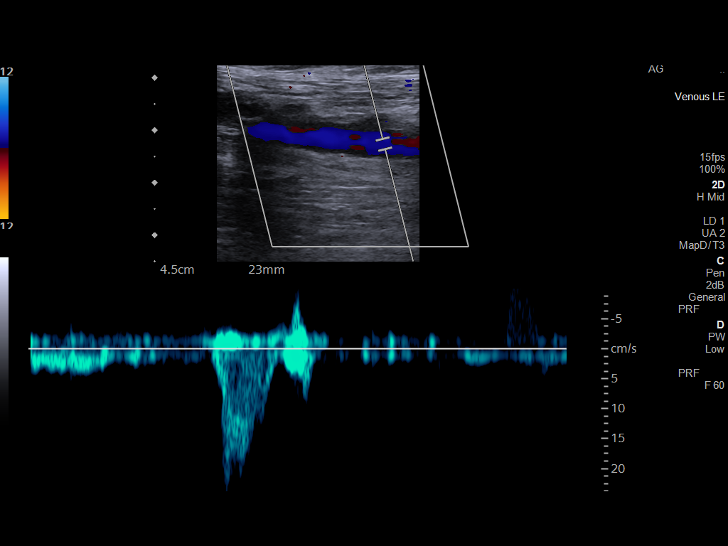
[im 24/39]
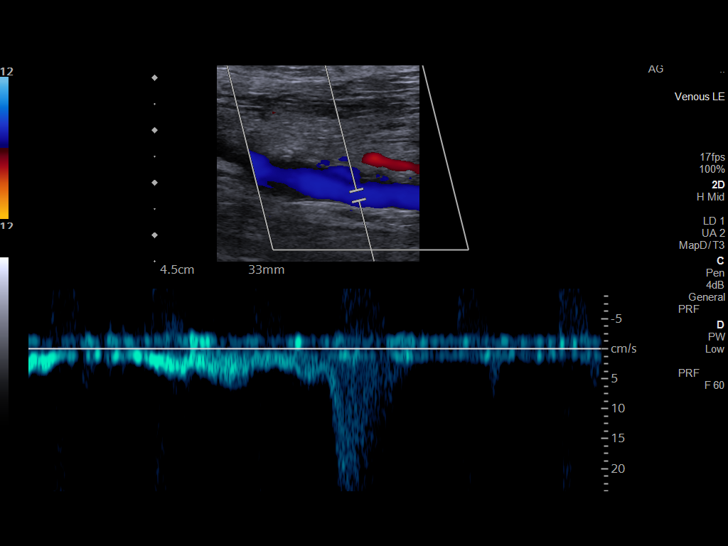
[im 27/39]
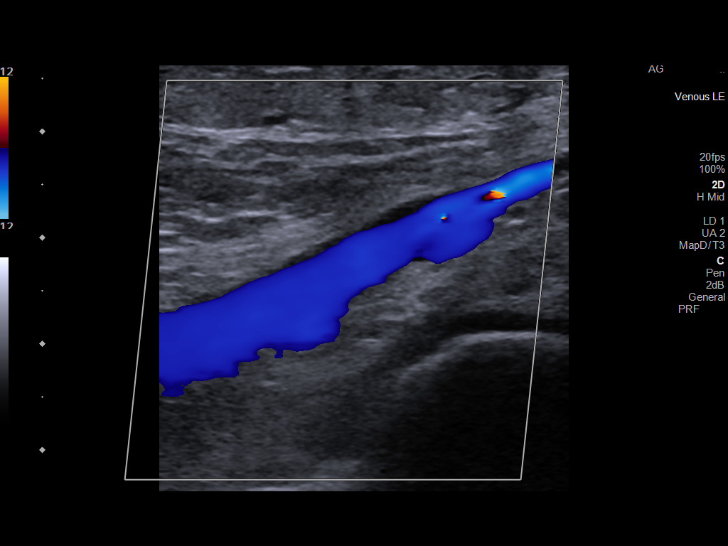
[im 30/39]
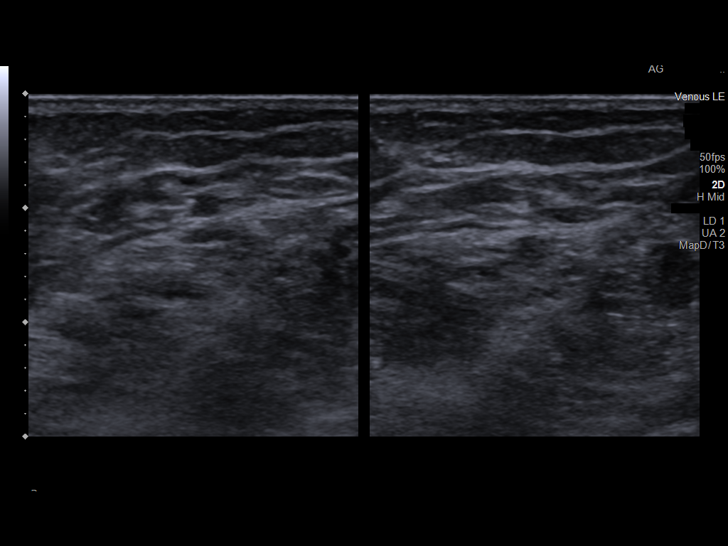
[im 32/39]
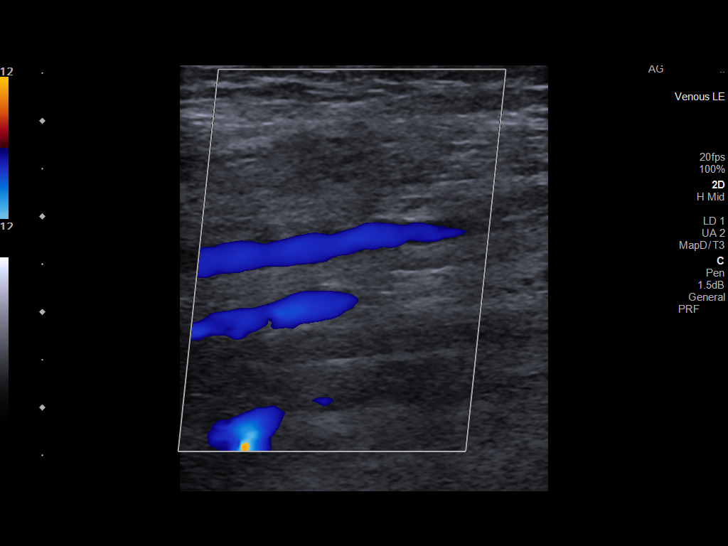
[im 35/39]
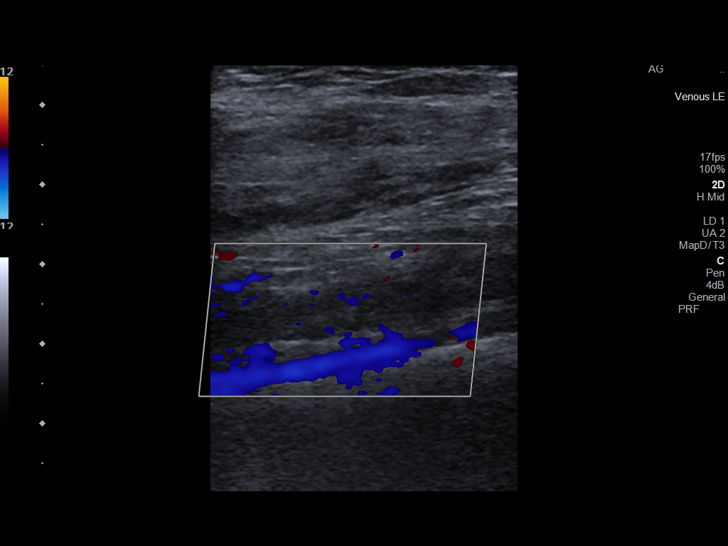
[im 39/39]
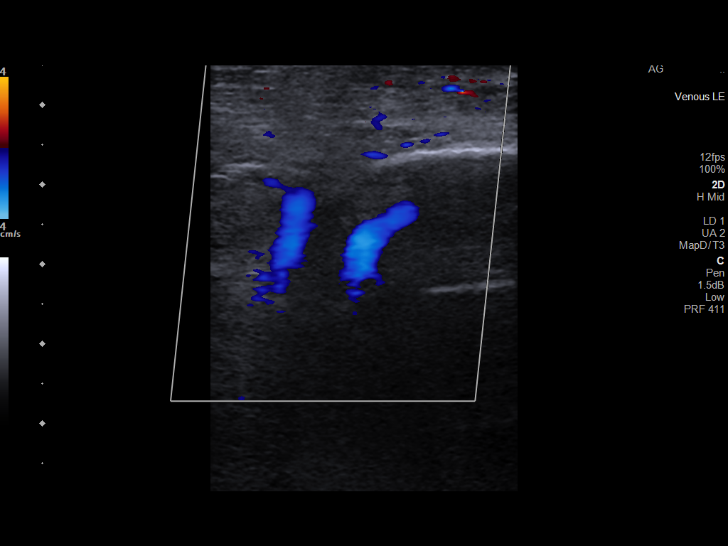

[14 of 24 positions shown; findings below may reference images not displayed]

FINDINGS: VENOUS

Normal compressibility of the common femoral, superficial femoral,
and popliteal veins, as well as the visualized calf veins.
Visualized portions of profunda femoral vein and great saphenous
vein unremarkable. No filling defects to suggest DVT on grayscale or
color Doppler imaging. Doppler waveforms show normal direction of
venous flow, normal respiratory plasticity and response to
augmentation.

Limited views of the contralateral common femoral vein are
unremarkable.

OTHER

None.

Limitations: none
IMPRESSION: Negative.

## 2022-07-22 ENCOUNTER — Other Ambulatory Visit: Payer: Self-pay | Admitting: Cardiology

## 2022-08-05 DIAGNOSIS — I209 Angina pectoris, unspecified: Secondary | ICD-10-CM | POA: Diagnosis not present

## 2022-08-05 DIAGNOSIS — E46 Unspecified protein-calorie malnutrition: Secondary | ICD-10-CM | POA: Diagnosis not present

## 2022-08-05 DIAGNOSIS — I10 Essential (primary) hypertension: Secondary | ICD-10-CM | POA: Diagnosis not present

## 2022-08-05 DIAGNOSIS — Z299 Encounter for prophylactic measures, unspecified: Secondary | ICD-10-CM | POA: Diagnosis not present

## 2022-08-05 DIAGNOSIS — I480 Paroxysmal atrial fibrillation: Secondary | ICD-10-CM | POA: Diagnosis not present

## 2022-08-05 DIAGNOSIS — Z681 Body mass index (BMI) 19 or less, adult: Secondary | ICD-10-CM | POA: Diagnosis not present

## 2022-09-16 DIAGNOSIS — H353211 Exudative age-related macular degeneration, right eye, with active choroidal neovascularization: Secondary | ICD-10-CM | POA: Diagnosis not present

## 2022-09-16 DIAGNOSIS — H35373 Puckering of macula, bilateral: Secondary | ICD-10-CM | POA: Diagnosis not present

## 2022-09-16 DIAGNOSIS — H353122 Nonexudative age-related macular degeneration, left eye, intermediate dry stage: Secondary | ICD-10-CM | POA: Diagnosis not present

## 2022-09-16 DIAGNOSIS — H40023 Open angle with borderline findings, high risk, bilateral: Secondary | ICD-10-CM | POA: Diagnosis not present

## 2022-09-28 DIAGNOSIS — H40013 Open angle with borderline findings, low risk, bilateral: Secondary | ICD-10-CM | POA: Diagnosis not present

## 2022-09-29 ENCOUNTER — Encounter: Payer: Self-pay | Admitting: Nurse Practitioner

## 2022-09-29 ENCOUNTER — Ambulatory Visit: Payer: Medicare Other | Attending: Nurse Practitioner | Admitting: Nurse Practitioner

## 2022-09-29 VITALS — BP 142/82 | HR 70 | Ht 63.0 in | Wt 95.2 lb

## 2022-09-29 DIAGNOSIS — I480 Paroxysmal atrial fibrillation: Secondary | ICD-10-CM | POA: Diagnosis not present

## 2022-09-29 DIAGNOSIS — I10 Essential (primary) hypertension: Secondary | ICD-10-CM | POA: Insufficient documentation

## 2022-09-29 DIAGNOSIS — I4892 Unspecified atrial flutter: Secondary | ICD-10-CM | POA: Insufficient documentation

## 2022-09-29 DIAGNOSIS — E785 Hyperlipidemia, unspecified: Secondary | ICD-10-CM | POA: Diagnosis not present

## 2022-09-29 NOTE — Patient Instructions (Addendum)
Medication Instructions:  Your physician recommends that you continue on your current medications as directed. Please refer to the Current Medication list given to you today.  Labwork: none  Testing/Procedures: none  Follow-Up: Your physician recommends that you schedule a follow-up appointment in: 6 months with Dr. McDowell  Any Other Special Instructions Will Be Listed Below (If Applicable).  If you need a refill on your cardiac medications before your next appointment, please call your pharmacy. 

## 2022-09-29 NOTE — Progress Notes (Unsigned)
Cardiology Office Note:    Date:  09/29/2022  ID:  Erica [name], DOB [birth year], MRN [id number]  PCP:  Monico Blitz, [facility] Cardiologist:  Rozann Lesches, MD     Referring MD: Monico Blitz, MD   CC: Here for regular follow-up  History of Present Illness:    Erica [name] is a 89 y.o. female with a hx of the following:  Atrial fibrillation, A-flutter Interstitial lung disease Macular degeneration  Patient is a delightful 89 year old female with past medical history as mentioned above.  Last seen by Levell July, NP on May 30, 2021.  Noted getting periodic injections in right eye for macular degeneration.  Denied any acute cardiac issues or concerns.  EKG in office that day showed normal sinus rhythm.  No medication changes were made.  Was told to follow-up in 1 year.  Today she presents for 1 overdue year follow-up.  She states she is doing well.  Denies any acute cardiac complaints or issues. Denies any chest pain, shortness of breath, palpitations, syncope, presyncope, dizziness, orthopnea, PND, swelling or significant weight changes, acute bleeding, or claudication.  Daughter who is present for visit states she is very independent and does chores at home and makes her bed.  Denies any other questions or concerns today.  SH: In her free time, she enjoys watching [employer] movies.   Past Medical History:  Diagnosis Date   Atrial fibrillation (HCC)    Declines Coumadin   Atrial flutter (HCC)    Status post RFA   Interstitial lung disease (HCC)    Mild, DLCO of 68%    Past Surgical History:  Procedure Laterality Date   LAPAROSCOPIC INGUINAL HERNIA REPAIR Right 01/15/2014    Current Medications: Current Meds  Medication Sig   ALPRAZolam (XANAX) 0.25 MG tablet Take 0.125 mg by mouth 2 (two) times daily as needed.    amiodarone (PACERONE) 200 MG tablet TAKE 1/2 TABLET EVERY DAY (PLEASE SCHEDULE APPOINTMENT)   aspirin 325 MG  tablet Take 1 tablet (325 mg total) by mouth daily.   brimonidine (ALPHAGAN) 0.2 % ophthalmic solution Place 1 drop into both eyes daily.   Calcium Carbonate-Vitamin D (CALTRATE 600+D PO) Take 1 tablet by mouth 2 (two) times daily.   levothyroxine (SYNTHROID, LEVOTHROID) 50 MCG tablet Take 50 mcg by mouth daily before breakfast.   Multiple Vitamin (MULTIVITAMIN) tablet Take 1 tablet by mouth daily.   Multiple Vitamins-Minerals (PRESERVISION AREDS PO) Take 1 tablet by mouth daily.    omeprazole (PRILOSEC) 20 MG capsule Take 20 mg by mouth 2 (two) times daily.    Polyethyl Glycol-Propyl Glycol (SYSTANE OP) Apply 2 drops to eye daily.   polyethylene glycol (MIRALAX / GLYCOLAX) packet Take 17 g by mouth as needed.    traMADol (ULTRAM) 50 MG tablet Take by mouth every 6 (six) hours as needed.     Allergies:   Ciprofloxacin   Social History   Socioeconomic History   Marital status: Married    Spouse name: Not on file   Number of children: Not on file   Years of education: Not on file   Highest education level: Not on file  Occupational History   Occupation: Retired    Fish farm manager: RETIRED  Tobacco Use   Smoking status: Never   Smokeless tobacco: Never  Vaping Use   Vaping Use: Never used  Substance and Sexual Activity   Alcohol use: No    Alcohol/week: 0.0 standard drinks of alcohol  Drug use: No   Sexual activity: Not on file  Other Topics Concern   Not on file  Social History Narrative   Not on file   Social Determinants of Health   Financial Resource Strain: Not on file  Food Insecurity: Not on file  Transportation Needs: Not on file  Physical Activity: Not on file  Stress: Not on file  Social Connections: Not on file     Family History: The patient's family history includes Diabetes in her brother, brother, and brother; Heart disease in her brother, brother, brother, father, and mother.  ROS:   Review of Systems  Constitutional: Negative.   HENT: Negative.     Eyes: Negative.   Respiratory: Negative.    Cardiovascular: Negative.   Gastrointestinal: Negative.   Genitourinary: Negative.   Musculoskeletal: Negative.   Skin: Negative.   Neurological: Negative.   Endo/Heme/Allergies: Negative.   Psychiatric/Behavioral: Negative.      Please see the history of present illness.    All other systems reviewed and are negative.  EKGs/Labs/Other Studies Reviewed:    The following studies were reviewed today:   EKG:  EKG is ordered today.  The ekg ordered today demonstrates SR, 72 bpm, with fusion complexes and PAC's, nonspecific ST segment changes, otherwise nothing acute.   Recent Labs: No results found for requested labs within last 365 days.  Recent Lipid Panel No results found for: "CHOL", "TRIG", "HDL", "CHOLHDL", "VLDL", "LDLCALC", "LDLDIRECT"   Risk Assessment/Calculations:    CHA2DS2-VASc Score = 4  This indicates a 4.8% annual risk of stroke. The patient's score is based upon: CHF History: 0 HTN History: 1 Diabetes History: 0 Stroke History: 0 Vascular Disease History: 0 Age Score: 2 Gender Score: 1     HYPERTENSION CONTROL Vitals:   09/29/22 1345 09/29/22 1350  BP: (!) 160/90 (!) 142/82    The patient's blood pressure is elevated above target today.  In order to address the patient's elevated BP: Blood pressure will be monitored at home to determine if medication changes need to be made.; Follow up with general cardiology has been recommended.; The blood pressure is usually elevated in clinic.  Blood pressures monitored at home have been optimal.            Physical Exam:    VS:  BP (!) 142/82 (BP Location: Left Arm, Patient Position: Sitting, Cuff Size: Normal)   Pulse 70   Ht 5\' 3"  (1.6 m)   Wt 95 lb 3.2 oz (43.2 kg)   SpO2 97%   BMI 16.86 kg/m     Wt Readings from Last 3 Encounters:  09/29/22 95 lb 3.2 oz (43.2 kg)  05/30/21 103 lb (46.7 kg)  06/10/20 94 lb 6.4 oz (42.8 kg)     GEN: Thin, frail  89 y.o. female in no acute distress HEENT: Normal NECK: No JVD; No carotid bruits CARDIAC: S1/S2, RRR, no murmurs, rubs, gallops; 2+ pulses RESPIRATORY:  Clear to auscultation without rales, wheezing or rhonchi  MUSCULOSKELETAL:  No edema; No deformity  SKIN: Thin skin, warm and dry NEUROLOGIC:  Alert and oriented x 3 PSYCHIATRIC:  Normal affect   ASSESSMENT:    1. Paroxysmal atrial fibrillation (HCC)   2. Atrial flutter, unspecified type ([place])   3. Essential hypertension, benign   4. Hyperlipidemia, unspecified hyperlipidemia type    PLAN:    In order of problems listed above:  PAF, A-flutter, hx of prior ablation Denies any tachycardia or palpitations.  Tolerating medications well.  Continue  amiodarone.  Patient has previously declined anticoagulation in the past, continue aspirin. Denies any bleeding issues. Heart healthy diet and regular cardiovascular exercise as tolerated encouraged. Will be seeing PCP soon for upcoming labs.   2. HTN Blood pressure on arrival today 160/90, repeat BP 142/82.  Daughter and patient admit to history of whitecoat hypertension.  BP well-controlled at home.  Continue current medication regimen. Discussed to monitor BP at home at least 2 hours after medications and sitting for 5-10 minutes. Heart healthy diet and regular cardiovascular exercise as tolerated encouraged.   3. HLD Lipid panel from 01/2022 revealed elevated LDL. Statin therapy contraindicated in adults over 75 who do not have type 2 diabetes of known vascular disease. PCP to manage. Continue to follow with PCP.    4. Disposition: Follow-up with Dr. Domenic Polite in 6 months of sooner if anything changes.    Medication Adjustments/Labs and Tests Ordered: Current medicines are reviewed at length with the patient today.  Concerns regarding medicines are outlined above.  Orders Placed This Encounter  Procedures   EKG 12-Lead   No orders of the defined types were placed in this  encounter.   Patient Instructions  Medication Instructions:  Your physician recommends that you continue on your current medications as directed. Please refer to the Current Medication list given to you today.  Labwork: none  Testing/Procedures: none  Follow-Up: Your physician recommends that you schedule a follow-up appointment in: 6 months with Dr. Domenic Polite  Any Other Special Instructions Will Be Listed Below (If Applicable).  If you need a refill on your cardiac medications before your next appointment, please call your pharmacy.   Signed, Finis Bud, NP  09/30/2022 3:48 PM    [place]

## 2022-09-30 ENCOUNTER — Encounter: Payer: Self-pay | Admitting: Nurse Practitioner

## 2022-10-03 ENCOUNTER — Other Ambulatory Visit: Payer: Self-pay | Admitting: Cardiology

## 2022-10-27 DIAGNOSIS — I4892 Unspecified atrial flutter: Secondary | ICD-10-CM | POA: Diagnosis not present

## 2022-10-27 DIAGNOSIS — I10 Essential (primary) hypertension: Secondary | ICD-10-CM | POA: Diagnosis not present

## 2022-10-27 DIAGNOSIS — L0390 Cellulitis, unspecified: Secondary | ICD-10-CM | POA: Diagnosis not present

## 2022-10-27 DIAGNOSIS — Z681 Body mass index (BMI) 19 or less, adult: Secondary | ICD-10-CM | POA: Diagnosis not present

## 2022-10-27 DIAGNOSIS — Z299 Encounter for prophylactic measures, unspecified: Secondary | ICD-10-CM | POA: Diagnosis not present

## 2022-11-04 DIAGNOSIS — I10 Essential (primary) hypertension: Secondary | ICD-10-CM | POA: Diagnosis not present

## 2022-11-04 DIAGNOSIS — F419 Anxiety disorder, unspecified: Secondary | ICD-10-CM | POA: Diagnosis not present

## 2022-11-04 DIAGNOSIS — Z299 Encounter for prophylactic measures, unspecified: Secondary | ICD-10-CM | POA: Diagnosis not present

## 2022-11-04 DIAGNOSIS — M549 Dorsalgia, unspecified: Secondary | ICD-10-CM | POA: Diagnosis not present

## 2022-12-18 ENCOUNTER — Other Ambulatory Visit: Payer: Self-pay | Admitting: Cardiology

## 2023-01-13 DIAGNOSIS — H40013 Open angle with borderline findings, low risk, bilateral: Secondary | ICD-10-CM | POA: Diagnosis not present

## 2023-01-13 DIAGNOSIS — H353211 Exudative age-related macular degeneration, right eye, with active choroidal neovascularization: Secondary | ICD-10-CM | POA: Diagnosis not present

## 2023-01-13 DIAGNOSIS — H353122 Nonexudative age-related macular degeneration, left eye, intermediate dry stage: Secondary | ICD-10-CM | POA: Diagnosis not present

## 2023-01-13 DIAGNOSIS — H35373 Puckering of macula, bilateral: Secondary | ICD-10-CM | POA: Diagnosis not present

## 2023-02-08 DIAGNOSIS — Z299 Encounter for prophylactic measures, unspecified: Secondary | ICD-10-CM | POA: Diagnosis not present

## 2023-02-08 DIAGNOSIS — Z0000 Encounter for general adult medical examination without abnormal findings: Secondary | ICD-10-CM | POA: Diagnosis not present

## 2023-02-08 DIAGNOSIS — Z7189 Other specified counseling: Secondary | ICD-10-CM | POA: Diagnosis not present

## 2023-02-08 DIAGNOSIS — Z1331 Encounter for screening for depression: Secondary | ICD-10-CM | POA: Diagnosis not present

## 2023-02-08 DIAGNOSIS — R5383 Other fatigue: Secondary | ICD-10-CM | POA: Diagnosis not present

## 2023-02-08 DIAGNOSIS — Z79899 Other long term (current) drug therapy: Secondary | ICD-10-CM | POA: Diagnosis not present

## 2023-02-08 DIAGNOSIS — E7800 Pure hypercholesterolemia, unspecified: Secondary | ICD-10-CM | POA: Diagnosis not present

## 2023-02-08 DIAGNOSIS — E039 Hypothyroidism, unspecified: Secondary | ICD-10-CM | POA: Diagnosis not present

## 2023-02-08 DIAGNOSIS — E559 Vitamin D deficiency, unspecified: Secondary | ICD-10-CM | POA: Diagnosis not present

## 2023-02-08 DIAGNOSIS — Z1339 Encounter for screening examination for other mental health and behavioral disorders: Secondary | ICD-10-CM | POA: Diagnosis not present

## 2023-03-23 ENCOUNTER — Ambulatory Visit: Payer: Medicare Other | Attending: Cardiology | Admitting: Cardiology

## 2023-03-23 ENCOUNTER — Encounter: Payer: Self-pay | Admitting: Cardiology

## 2023-03-23 VITALS — BP 122/80 | HR 71 | Ht 62.0 in | Wt 90.4 lb

## 2023-03-23 DIAGNOSIS — I480 Paroxysmal atrial fibrillation: Secondary | ICD-10-CM | POA: Insufficient documentation

## 2023-03-23 NOTE — Patient Instructions (Addendum)

## 2023-03-23 NOTE — Progress Notes (Signed)
    Cardiology Office Note  Date: 03/23/2023   ID: Erica [name], DOB [birth year], MRN [id number]  History of Present Illness: Erica [name] is a 89 y.o. female last seen in January by Ms. Philis Nettle NP, I reviewed the note.  She is here today with family member for a follow-up visit.  No reported change in status, specifically no recurring palpitations or chest pain.  She is relatively sedentary, enjoys watching television, but also goes out some to tend her flowers when the weather allows.  I reviewed her lab work from June.  We also went over her medications.  She remains on aspirin and amiodarone.  ECG from January is noted below.  Physical Exam: VS:  BP 122/80   Pulse 71   Ht 5\' 2"  (1.575 m)   Wt 90 lb 6.4 oz (41 kg)   SpO2 96%   BMI 16.53 kg/m , BMI Body mass index is 16.53 kg/m.  Wt Readings from Last 3 Encounters:  03/23/23 90 lb 6.4 oz (41 kg)  09/29/22 95 lb 3.2 oz (43.2 kg)  05/30/21 103 lb (46.7 kg)    General: Patient appears comfortable at rest. HEENT: Conjunctiva and lids normal. Lungs: Clear to auscultation, nonlabored breathing at rest. Cardiac: Regular rate and rhythm, no S3 or significant systolic murmur. Extremities: No pitting edema.  ECG:  An ECG dated 09/29/2022 was personally reviewed today and demonstrated:  Sinus rhythm with PACs and PVCs, leftward axis.  Labwork:  June 2024: Hemoglobin 13.2, platelets 334, BUN 13, creatinine 0.62, potassium 4.2, AST 24, ALT 9, cholesterol 183, triglycerides 85, HDL 62, LDL 105, TSH 1.06  Other Studies Reviewed Today:  No interval cardiac testing for review today.  Assessment and Plan:  1.  Paroxysmal to persistent atrial fibrillation/flutter with CHA2DS2-VASc score of 4-5.  She has consistently declined anticoagulation.  Continue aspirin and amiodarone which has provided good rhythm suppression in terms of symptoms.  Recent LFTs normal.  2.  Elevated blood pressure by history with whitecoat hypertension.  Blood  pressure is normal today.  Disposition:  Follow up  6 months.  Signed, Jonelle Sidle, M.D., F.A.C.C. [facility]

## 2023-04-27 DIAGNOSIS — H40013 Open angle with borderline findings, low risk, bilateral: Secondary | ICD-10-CM | POA: Diagnosis not present

## 2023-05-05 DIAGNOSIS — H353211 Exudative age-related macular degeneration, right eye, with active choroidal neovascularization: Secondary | ICD-10-CM | POA: Diagnosis not present

## 2023-05-05 DIAGNOSIS — H40013 Open angle with borderline findings, low risk, bilateral: Secondary | ICD-10-CM | POA: Diagnosis not present

## 2023-05-05 DIAGNOSIS — H353122 Nonexudative age-related macular degeneration, left eye, intermediate dry stage: Secondary | ICD-10-CM | POA: Diagnosis not present

## 2023-05-05 DIAGNOSIS — H35373 Puckering of macula, bilateral: Secondary | ICD-10-CM | POA: Diagnosis not present

## 2023-05-11 DIAGNOSIS — Z681 Body mass index (BMI) 19 or less, adult: Secondary | ICD-10-CM | POA: Diagnosis not present

## 2023-05-11 DIAGNOSIS — Z299 Encounter for prophylactic measures, unspecified: Secondary | ICD-10-CM | POA: Diagnosis not present

## 2023-05-11 DIAGNOSIS — I10 Essential (primary) hypertension: Secondary | ICD-10-CM | POA: Diagnosis not present

## 2023-06-08 DIAGNOSIS — I10 Essential (primary) hypertension: Secondary | ICD-10-CM | POA: Diagnosis not present

## 2023-06-08 DIAGNOSIS — Z23 Encounter for immunization: Secondary | ICD-10-CM | POA: Diagnosis not present

## 2023-06-08 DIAGNOSIS — Z681 Body mass index (BMI) 19 or less, adult: Secondary | ICD-10-CM | POA: Diagnosis not present

## 2023-06-08 DIAGNOSIS — Z299 Encounter for prophylactic measures, unspecified: Secondary | ICD-10-CM | POA: Diagnosis not present

## 2023-07-05 DIAGNOSIS — I480 Paroxysmal atrial fibrillation: Secondary | ICD-10-CM | POA: Diagnosis not present

## 2023-07-05 DIAGNOSIS — R5383 Other fatigue: Secondary | ICD-10-CM | POA: Diagnosis not present

## 2023-07-05 DIAGNOSIS — J069 Acute upper respiratory infection, unspecified: Secondary | ICD-10-CM | POA: Diagnosis not present

## 2023-07-05 DIAGNOSIS — Z299 Encounter for prophylactic measures, unspecified: Secondary | ICD-10-CM | POA: Diagnosis not present

## 2023-07-05 DIAGNOSIS — E46 Unspecified protein-calorie malnutrition: Secondary | ICD-10-CM | POA: Diagnosis not present

## 2023-09-08 DIAGNOSIS — H35373 Puckering of macula, bilateral: Secondary | ICD-10-CM | POA: Diagnosis not present

## 2023-09-08 DIAGNOSIS — H353211 Exudative age-related macular degeneration, right eye, with active choroidal neovascularization: Secondary | ICD-10-CM | POA: Diagnosis not present

## 2023-09-08 DIAGNOSIS — H40013 Open angle with borderline findings, low risk, bilateral: Secondary | ICD-10-CM | POA: Diagnosis not present

## 2023-09-08 DIAGNOSIS — H353122 Nonexudative age-related macular degeneration, left eye, intermediate dry stage: Secondary | ICD-10-CM | POA: Diagnosis not present

## 2023-09-15 DIAGNOSIS — Z299 Encounter for prophylactic measures, unspecified: Secondary | ICD-10-CM | POA: Diagnosis not present

## 2023-09-15 DIAGNOSIS — I10 Essential (primary) hypertension: Secondary | ICD-10-CM | POA: Diagnosis not present

## 2023-09-15 DIAGNOSIS — I480 Paroxysmal atrial fibrillation: Secondary | ICD-10-CM | POA: Diagnosis not present

## 2023-10-04 DIAGNOSIS — I480 Paroxysmal atrial fibrillation: Secondary | ICD-10-CM | POA: Diagnosis not present

## 2023-10-04 DIAGNOSIS — I10 Essential (primary) hypertension: Secondary | ICD-10-CM | POA: Diagnosis not present

## 2023-10-04 DIAGNOSIS — E46 Unspecified protein-calorie malnutrition: Secondary | ICD-10-CM | POA: Diagnosis not present

## 2023-10-04 DIAGNOSIS — Z299 Encounter for prophylactic measures, unspecified: Secondary | ICD-10-CM | POA: Diagnosis not present

## 2023-10-04 DIAGNOSIS — M549 Dorsalgia, unspecified: Secondary | ICD-10-CM | POA: Diagnosis not present

## 2023-10-11 ENCOUNTER — Other Ambulatory Visit: Payer: Self-pay | Admitting: Cardiology

## 2023-10-19 DIAGNOSIS — M5136 Other intervertebral disc degeneration, lumbar region with discogenic back pain only: Secondary | ICD-10-CM | POA: Diagnosis not present

## 2023-10-19 DIAGNOSIS — M4317 Spondylolisthesis, lumbosacral region: Secondary | ICD-10-CM | POA: Diagnosis not present

## 2023-10-19 DIAGNOSIS — R252 Cramp and spasm: Secondary | ICD-10-CM | POA: Diagnosis not present

## 2023-10-19 DIAGNOSIS — M5134 Other intervertebral disc degeneration, thoracic region: Secondary | ICD-10-CM | POA: Diagnosis not present

## 2023-10-19 DIAGNOSIS — I4892 Unspecified atrial flutter: Secondary | ICD-10-CM | POA: Diagnosis not present

## 2023-10-19 DIAGNOSIS — Z299 Encounter for prophylactic measures, unspecified: Secondary | ICD-10-CM | POA: Diagnosis not present

## 2023-10-19 DIAGNOSIS — M549 Dorsalgia, unspecified: Secondary | ICD-10-CM | POA: Diagnosis not present

## 2023-11-01 DIAGNOSIS — H40013 Open angle with borderline findings, low risk, bilateral: Secondary | ICD-10-CM | POA: Diagnosis not present

## 2023-11-24 DIAGNOSIS — R110 Nausea: Secondary | ICD-10-CM | POA: Diagnosis not present

## 2023-11-24 DIAGNOSIS — M6281 Muscle weakness (generalized): Secondary | ICD-10-CM | POA: Diagnosis not present

## 2023-11-24 DIAGNOSIS — Z90710 Acquired absence of both cervix and uterus: Secondary | ICD-10-CM | POA: Diagnosis not present

## 2023-11-24 DIAGNOSIS — M62561 Muscle wasting and atrophy, not elsewhere classified, right lower leg: Secondary | ICD-10-CM | POA: Diagnosis not present

## 2023-11-24 DIAGNOSIS — Z1152 Encounter for screening for COVID-19: Secondary | ICD-10-CM | POA: Diagnosis not present

## 2023-11-24 DIAGNOSIS — R64 Cachexia: Secondary | ICD-10-CM | POA: Diagnosis not present

## 2023-11-24 DIAGNOSIS — R531 Weakness: Secondary | ICD-10-CM | POA: Diagnosis not present

## 2023-11-24 DIAGNOSIS — M62521 Muscle wasting and atrophy, not elsewhere classified, right upper arm: Secondary | ICD-10-CM | POA: Diagnosis not present

## 2023-11-24 DIAGNOSIS — I5021 Acute systolic (congestive) heart failure: Secondary | ICD-10-CM | POA: Diagnosis not present

## 2023-11-24 DIAGNOSIS — I4892 Unspecified atrial flutter: Secondary | ICD-10-CM | POA: Diagnosis present

## 2023-11-24 DIAGNOSIS — I351 Nonrheumatic aortic (valve) insufficiency: Secondary | ICD-10-CM | POA: Diagnosis not present

## 2023-11-24 DIAGNOSIS — I4891 Unspecified atrial fibrillation: Secondary | ICD-10-CM | POA: Diagnosis not present

## 2023-11-24 DIAGNOSIS — K5730 Diverticulosis of large intestine without perforation or abscess without bleeding: Secondary | ICD-10-CM | POA: Diagnosis not present

## 2023-11-24 DIAGNOSIS — I10 Essential (primary) hypertension: Secondary | ICD-10-CM | POA: Diagnosis present

## 2023-11-24 DIAGNOSIS — Z743 Need for continuous supervision: Secondary | ICD-10-CM | POA: Diagnosis not present

## 2023-11-24 DIAGNOSIS — M62522 Muscle wasting and atrophy, not elsewhere classified, left upper arm: Secondary | ICD-10-CM | POA: Diagnosis not present

## 2023-11-24 DIAGNOSIS — I5023 Acute on chronic systolic (congestive) heart failure: Secondary | ICD-10-CM | POA: Diagnosis not present

## 2023-11-24 DIAGNOSIS — K2900 Acute gastritis without bleeding: Secondary | ICD-10-CM | POA: Diagnosis not present

## 2023-11-24 DIAGNOSIS — R41841 Cognitive communication deficit: Secondary | ICD-10-CM | POA: Diagnosis not present

## 2023-11-24 DIAGNOSIS — R059 Cough, unspecified: Secondary | ICD-10-CM | POA: Diagnosis not present

## 2023-11-24 DIAGNOSIS — Z66 Do not resuscitate: Secondary | ICD-10-CM | POA: Diagnosis not present

## 2023-11-24 DIAGNOSIS — I429 Cardiomyopathy, unspecified: Secondary | ICD-10-CM | POA: Diagnosis present

## 2023-11-24 DIAGNOSIS — R1310 Dysphagia, unspecified: Secondary | ICD-10-CM | POA: Diagnosis not present

## 2023-11-24 DIAGNOSIS — Z79890 Hormone replacement therapy (postmenopausal): Secondary | ICD-10-CM | POA: Diagnosis not present

## 2023-11-24 DIAGNOSIS — R000 Tachycardia, unspecified: Secondary | ICD-10-CM | POA: Diagnosis not present

## 2023-11-24 DIAGNOSIS — Z7982 Long term (current) use of aspirin: Secondary | ICD-10-CM | POA: Diagnosis not present

## 2023-11-24 DIAGNOSIS — R5381 Other malaise: Secondary | ICD-10-CM | POA: Diagnosis not present

## 2023-11-24 DIAGNOSIS — Z681 Body mass index (BMI) 19 or less, adult: Secondary | ICD-10-CM | POA: Diagnosis not present

## 2023-11-24 DIAGNOSIS — I358 Other nonrheumatic aortic valve disorders: Secondary | ICD-10-CM | POA: Diagnosis not present

## 2023-11-24 DIAGNOSIS — E039 Hypothyroidism, unspecified: Secondary | ICD-10-CM | POA: Diagnosis present

## 2023-11-24 DIAGNOSIS — K5289 Other specified noninfective gastroenteritis and colitis: Secondary | ICD-10-CM | POA: Diagnosis present

## 2023-11-24 DIAGNOSIS — M62562 Muscle wasting and atrophy, not elsewhere classified, left lower leg: Secondary | ICD-10-CM | POA: Diagnosis not present

## 2023-11-24 DIAGNOSIS — R918 Other nonspecific abnormal finding of lung field: Secondary | ICD-10-CM | POA: Diagnosis not present

## 2023-11-24 DIAGNOSIS — K5900 Constipation, unspecified: Secondary | ICD-10-CM | POA: Diagnosis not present

## 2023-11-24 DIAGNOSIS — E440 Moderate protein-calorie malnutrition: Secondary | ICD-10-CM | POA: Diagnosis not present

## 2023-11-24 DIAGNOSIS — K2970 Gastritis, unspecified, without bleeding: Secondary | ICD-10-CM | POA: Diagnosis not present

## 2023-11-24 DIAGNOSIS — H353290 Exudative age-related macular degeneration, unspecified eye, stage unspecified: Secondary | ICD-10-CM | POA: Diagnosis not present

## 2023-11-24 DIAGNOSIS — Z881 Allergy status to other antibiotic agents status: Secondary | ICD-10-CM | POA: Diagnosis not present

## 2023-11-24 DIAGNOSIS — E46 Unspecified protein-calorie malnutrition: Secondary | ICD-10-CM | POA: Diagnosis not present

## 2023-11-24 DIAGNOSIS — R2689 Other abnormalities of gait and mobility: Secondary | ICD-10-CM | POA: Diagnosis not present

## 2023-11-24 DIAGNOSIS — K219 Gastro-esophageal reflux disease without esophagitis: Secondary | ICD-10-CM | POA: Diagnosis present

## 2023-11-24 DIAGNOSIS — R627 Adult failure to thrive: Secondary | ICD-10-CM | POA: Diagnosis not present

## 2023-11-24 DIAGNOSIS — Z79899 Other long term (current) drug therapy: Secondary | ICD-10-CM | POA: Diagnosis not present

## 2023-11-24 DIAGNOSIS — I340 Nonrheumatic mitral (valve) insufficiency: Secondary | ICD-10-CM | POA: Diagnosis not present

## 2023-11-24 DIAGNOSIS — I2510 Atherosclerotic heart disease of native coronary artery without angina pectoris: Secondary | ICD-10-CM | POA: Diagnosis not present

## 2023-11-24 DIAGNOSIS — J984 Other disorders of lung: Secondary | ICD-10-CM | POA: Diagnosis not present

## 2023-11-24 DIAGNOSIS — F419 Anxiety disorder, unspecified: Secondary | ICD-10-CM | POA: Diagnosis not present

## 2023-11-24 DIAGNOSIS — I361 Nonrheumatic tricuspid (valve) insufficiency: Secondary | ICD-10-CM | POA: Diagnosis not present

## 2023-11-24 DIAGNOSIS — R638 Other symptoms and signs concerning food and fluid intake: Secondary | ICD-10-CM | POA: Diagnosis not present

## 2023-11-24 DIAGNOSIS — E860 Dehydration: Secondary | ICD-10-CM | POA: Diagnosis not present

## 2023-11-24 DIAGNOSIS — J849 Interstitial pulmonary disease, unspecified: Secondary | ICD-10-CM | POA: Diagnosis not present

## 2023-11-29 DIAGNOSIS — E440 Moderate protein-calorie malnutrition: Secondary | ICD-10-CM | POA: Diagnosis not present

## 2023-11-29 DIAGNOSIS — R5381 Other malaise: Secondary | ICD-10-CM | POA: Diagnosis not present

## 2023-11-29 DIAGNOSIS — R531 Weakness: Secondary | ICD-10-CM | POA: Diagnosis not present

## 2023-11-29 DIAGNOSIS — E039 Hypothyroidism, unspecified: Secondary | ICD-10-CM | POA: Diagnosis not present

## 2023-11-29 DIAGNOSIS — K219 Gastro-esophageal reflux disease without esophagitis: Secondary | ICD-10-CM | POA: Diagnosis not present

## 2023-11-29 DIAGNOSIS — M62561 Muscle wasting and atrophy, not elsewhere classified, right lower leg: Secondary | ICD-10-CM | POA: Diagnosis not present

## 2023-11-29 DIAGNOSIS — I5023 Acute on chronic systolic (congestive) heart failure: Secondary | ICD-10-CM | POA: Diagnosis not present

## 2023-11-29 DIAGNOSIS — H353290 Exudative age-related macular degeneration, unspecified eye, stage unspecified: Secondary | ICD-10-CM | POA: Diagnosis not present

## 2023-11-29 DIAGNOSIS — I10 Essential (primary) hypertension: Secondary | ICD-10-CM | POA: Diagnosis not present

## 2023-11-29 DIAGNOSIS — K2970 Gastritis, unspecified, without bleeding: Secondary | ICD-10-CM | POA: Diagnosis not present

## 2023-11-29 DIAGNOSIS — M62521 Muscle wasting and atrophy, not elsewhere classified, right upper arm: Secondary | ICD-10-CM | POA: Diagnosis not present

## 2023-11-29 DIAGNOSIS — R2689 Other abnormalities of gait and mobility: Secondary | ICD-10-CM | POA: Diagnosis not present

## 2023-11-29 DIAGNOSIS — R1310 Dysphagia, unspecified: Secondary | ICD-10-CM | POA: Diagnosis not present

## 2023-11-29 DIAGNOSIS — E860 Dehydration: Secondary | ICD-10-CM | POA: Diagnosis not present

## 2023-11-29 DIAGNOSIS — I4891 Unspecified atrial fibrillation: Secondary | ICD-10-CM | POA: Diagnosis not present

## 2023-11-29 DIAGNOSIS — M6281 Muscle weakness (generalized): Secondary | ICD-10-CM | POA: Diagnosis not present

## 2023-11-29 DIAGNOSIS — Z743 Need for continuous supervision: Secondary | ICD-10-CM | POA: Diagnosis not present

## 2023-11-29 DIAGNOSIS — M62562 Muscle wasting and atrophy, not elsewhere classified, left lower leg: Secondary | ICD-10-CM | POA: Diagnosis not present

## 2023-11-29 DIAGNOSIS — I2510 Atherosclerotic heart disease of native coronary artery without angina pectoris: Secondary | ICD-10-CM | POA: Diagnosis not present

## 2023-11-29 DIAGNOSIS — R41841 Cognitive communication deficit: Secondary | ICD-10-CM | POA: Diagnosis not present

## 2023-11-29 DIAGNOSIS — K5289 Other specified noninfective gastroenteritis and colitis: Secondary | ICD-10-CM | POA: Diagnosis not present

## 2023-11-29 DIAGNOSIS — Z79890 Hormone replacement therapy (postmenopausal): Secondary | ICD-10-CM | POA: Diagnosis not present

## 2023-11-29 DIAGNOSIS — J849 Interstitial pulmonary disease, unspecified: Secondary | ICD-10-CM | POA: Diagnosis not present

## 2023-11-29 DIAGNOSIS — M62522 Muscle wasting and atrophy, not elsewhere classified, left upper arm: Secondary | ICD-10-CM | POA: Diagnosis not present

## 2023-11-29 DIAGNOSIS — F419 Anxiety disorder, unspecified: Secondary | ICD-10-CM | POA: Diagnosis not present

## 2023-11-29 DIAGNOSIS — Z79899 Other long term (current) drug therapy: Secondary | ICD-10-CM | POA: Diagnosis not present

## 2023-11-29 DIAGNOSIS — R110 Nausea: Secondary | ICD-10-CM | POA: Diagnosis not present

## 2023-11-30 DIAGNOSIS — R531 Weakness: Secondary | ICD-10-CM | POA: Diagnosis not present

## 2023-12-02 DIAGNOSIS — E43 Unspecified severe protein-calorie malnutrition: Secondary | ICD-10-CM | POA: Diagnosis not present

## 2023-12-02 DIAGNOSIS — R110 Nausea: Secondary | ICD-10-CM | POA: Diagnosis not present

## 2023-12-02 DIAGNOSIS — Z7401 Bed confinement status: Secondary | ICD-10-CM | POA: Diagnosis not present

## 2023-12-02 DIAGNOSIS — R52 Pain, unspecified: Secondary | ICD-10-CM | POA: Diagnosis not present

## 2023-12-02 DIAGNOSIS — H04129 Dry eye syndrome of unspecified lacrimal gland: Secondary | ICD-10-CM | POA: Diagnosis not present

## 2023-12-02 DIAGNOSIS — R64 Cachexia: Secondary | ICD-10-CM | POA: Diagnosis not present

## 2023-12-02 DIAGNOSIS — I5023 Acute on chronic systolic (congestive) heart failure: Secondary | ICD-10-CM | POA: Diagnosis not present

## 2023-12-02 DIAGNOSIS — Z66 Do not resuscitate: Secondary | ICD-10-CM | POA: Diagnosis not present

## 2023-12-02 DIAGNOSIS — M6281 Muscle weakness (generalized): Secondary | ICD-10-CM | POA: Diagnosis not present

## 2023-12-02 DIAGNOSIS — I4891 Unspecified atrial fibrillation: Secondary | ICD-10-CM | POA: Diagnosis not present

## 2023-12-03 DIAGNOSIS — R110 Nausea: Secondary | ICD-10-CM | POA: Diagnosis not present

## 2023-12-03 DIAGNOSIS — Z7401 Bed confinement status: Secondary | ICD-10-CM | POA: Diagnosis not present

## 2023-12-03 DIAGNOSIS — R52 Pain, unspecified: Secondary | ICD-10-CM | POA: Diagnosis not present

## 2023-12-03 DIAGNOSIS — Z66 Do not resuscitate: Secondary | ICD-10-CM | POA: Diagnosis not present

## 2023-12-03 DIAGNOSIS — E43 Unspecified severe protein-calorie malnutrition: Secondary | ICD-10-CM | POA: Diagnosis not present

## 2023-12-03 DIAGNOSIS — I5023 Acute on chronic systolic (congestive) heart failure: Secondary | ICD-10-CM | POA: Diagnosis not present

## 2023-12-05 DIAGNOSIS — R52 Pain, unspecified: Secondary | ICD-10-CM | POA: Diagnosis not present

## 2023-12-05 DIAGNOSIS — E43 Unspecified severe protein-calorie malnutrition: Secondary | ICD-10-CM | POA: Diagnosis not present

## 2023-12-05 DIAGNOSIS — I5023 Acute on chronic systolic (congestive) heart failure: Secondary | ICD-10-CM | POA: Diagnosis not present

## 2023-12-05 DIAGNOSIS — Z7401 Bed confinement status: Secondary | ICD-10-CM | POA: Diagnosis not present

## 2023-12-05 DIAGNOSIS — Z66 Do not resuscitate: Secondary | ICD-10-CM | POA: Diagnosis not present

## 2023-12-05 DIAGNOSIS — R110 Nausea: Secondary | ICD-10-CM | POA: Diagnosis not present

## 2023-12-30 DEATH — deceased

## 2024-01-25 ENCOUNTER — Ambulatory Visit: Admitting: Nurse Practitioner
